# Patient Record
Sex: Male | Born: 1985 | Race: White | Hispanic: No | Marital: Single | Smoking: Current every day smoker
Health system: Southern US, Community
[De-identification: ages and names within clinical notes are randomized; demographics above are authoritative.]

## PROBLEM LIST (undated history)

## (undated) DIAGNOSIS — F419 Anxiety disorder, unspecified: Secondary | ICD-10-CM

## (undated) DIAGNOSIS — F209 Schizophrenia, unspecified: Secondary | ICD-10-CM

## (undated) DIAGNOSIS — K469 Unspecified abdominal hernia without obstruction or gangrene: Secondary | ICD-10-CM

## (undated) DIAGNOSIS — R591 Generalized enlarged lymph nodes: Secondary | ICD-10-CM

## (undated) DIAGNOSIS — N489 Disorder of penis, unspecified: Secondary | ICD-10-CM

## (undated) DIAGNOSIS — K4090 Unilateral inguinal hernia, without obstruction or gangrene, not specified as recurrent: Secondary | ICD-10-CM

## (undated) DIAGNOSIS — R5383 Other fatigue: Secondary | ICD-10-CM

## (undated) DIAGNOSIS — E785 Hyperlipidemia, unspecified: Secondary | ICD-10-CM

## (undated) DIAGNOSIS — F1991 Other psychoactive substance use, unspecified, in remission: Secondary | ICD-10-CM

## (undated) DIAGNOSIS — F32A Depression, unspecified: Secondary | ICD-10-CM

## (undated) DIAGNOSIS — F1290 Cannabis use, unspecified, uncomplicated: Secondary | ICD-10-CM

## (undated) DIAGNOSIS — F329 Major depressive disorder, single episode, unspecified: Secondary | ICD-10-CM

## (undated) DIAGNOSIS — R2231 Localized swelling, mass and lump, right upper limb: Secondary | ICD-10-CM

## (undated) DIAGNOSIS — K439 Ventral hernia without obstruction or gangrene: Secondary | ICD-10-CM

## (undated) DIAGNOSIS — F17200 Nicotine dependence, unspecified, uncomplicated: Secondary | ICD-10-CM

## (undated) HISTORY — DX: Depression, unspecified: F32.A

## (undated) HISTORY — DX: Hyperlipidemia, unspecified: E78.5

## (undated) HISTORY — DX: Anxiety disorder, unspecified: F41.9

---
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
## 2005-03-26 ENCOUNTER — Emergency Department (HOSPITAL_COMMUNITY): Admission: EM | Admit: 2005-03-26 | Discharge: 2005-03-27 | Payer: Self-pay | Admitting: Emergency Medicine

## 2005-03-27 ENCOUNTER — Ambulatory Visit: Payer: Self-pay | Admitting: Psychiatry

## 2005-03-27 ENCOUNTER — Inpatient Hospital Stay (HOSPITAL_COMMUNITY): Admission: RE | Admit: 2005-03-27 | Discharge: 2005-03-31 | Payer: Self-pay | Admitting: Psychiatry

## 2005-04-16 ENCOUNTER — Ambulatory Visit (HOSPITAL_COMMUNITY): Payer: Self-pay | Admitting: Psychiatry

## 2005-04-19 ENCOUNTER — Ambulatory Visit (HOSPITAL_COMMUNITY): Payer: Self-pay | Admitting: Psychiatry

## 2005-04-29 ENCOUNTER — Ambulatory Visit (HOSPITAL_COMMUNITY): Payer: Self-pay | Admitting: Psychiatry

## 2005-05-23 ENCOUNTER — Ambulatory Visit (HOSPITAL_COMMUNITY): Payer: Self-pay | Admitting: Psychiatry

## 2005-06-03 ENCOUNTER — Ambulatory Visit (HOSPITAL_COMMUNITY): Payer: Self-pay | Admitting: Psychiatry

## 2005-06-04 ENCOUNTER — Ambulatory Visit (HOSPITAL_COMMUNITY): Payer: Self-pay | Admitting: Psychiatry

## 2005-07-03 ENCOUNTER — Ambulatory Visit (HOSPITAL_COMMUNITY): Payer: Self-pay | Admitting: Psychiatry

## 2005-08-07 ENCOUNTER — Ambulatory Visit (HOSPITAL_COMMUNITY): Payer: Self-pay | Admitting: Psychiatry

## 2005-08-23 ENCOUNTER — Ambulatory Visit (HOSPITAL_COMMUNITY): Payer: Self-pay | Admitting: Psychiatry

## 2005-09-06 ENCOUNTER — Ambulatory Visit (HOSPITAL_COMMUNITY): Payer: Self-pay | Admitting: Psychiatry

## 2005-10-11 ENCOUNTER — Ambulatory Visit (HOSPITAL_COMMUNITY): Payer: Self-pay | Admitting: Psychiatry

## 2005-11-15 ENCOUNTER — Ambulatory Visit (HOSPITAL_COMMUNITY): Admission: RE | Admit: 2005-11-15 | Discharge: 2005-11-15 | Payer: Self-pay | Admitting: Psychiatry

## 2006-01-03 ENCOUNTER — Ambulatory Visit (HOSPITAL_COMMUNITY): Payer: Self-pay | Admitting: Psychiatry

## 2006-01-28 ENCOUNTER — Ambulatory Visit (HOSPITAL_COMMUNITY): Payer: Self-pay | Admitting: Psychiatry

## 2006-03-31 ENCOUNTER — Ambulatory Visit (HOSPITAL_COMMUNITY): Payer: Self-pay | Admitting: Psychiatry

## 2006-05-26 ENCOUNTER — Ambulatory Visit (HOSPITAL_COMMUNITY): Payer: Self-pay | Admitting: Psychiatry

## 2006-08-18 ENCOUNTER — Ambulatory Visit (HOSPITAL_COMMUNITY): Payer: Self-pay | Admitting: Psychiatry

## 2006-11-20 ENCOUNTER — Ambulatory Visit (HOSPITAL_COMMUNITY): Payer: Self-pay | Admitting: Psychiatry

## 2009-08-24 ENCOUNTER — Encounter: Payer: Self-pay | Admitting: Family Medicine

## 2009-09-21 ENCOUNTER — Encounter: Payer: Self-pay | Admitting: Family Medicine

## 2009-10-05 ENCOUNTER — Encounter: Payer: Self-pay | Admitting: Family Medicine

## 2009-11-02 ENCOUNTER — Encounter: Payer: Self-pay | Admitting: Family Medicine

## 2009-12-07 ENCOUNTER — Encounter: Payer: Self-pay | Admitting: Family Medicine

## 2010-03-07 ENCOUNTER — Encounter: Payer: Self-pay | Admitting: Family Medicine

## 2010-04-05 ENCOUNTER — Encounter: Payer: Self-pay | Admitting: Family Medicine

## 2010-07-05 ENCOUNTER — Encounter: Payer: Self-pay | Admitting: Family Medicine

## 2010-07-24 ENCOUNTER — Ambulatory Visit: Payer: Self-pay | Admitting: Family Medicine

## 2010-07-24 ENCOUNTER — Encounter: Payer: Self-pay | Admitting: Family Medicine

## 2010-07-24 DIAGNOSIS — F209 Schizophrenia, unspecified: Secondary | ICD-10-CM

## 2010-07-26 ENCOUNTER — Ambulatory Visit: Payer: Self-pay

## 2010-08-14 ENCOUNTER — Encounter: Payer: Self-pay | Admitting: Family Medicine

## 2010-09-06 NOTE — Consult Note (Signed)
[facility]   Imported By: De Nurse 07/26/2010 16:27:53  _____________________________________________________________________  External Attachment:    Type:   Image     Comment:   External Document

## 2010-09-06 NOTE — Consult Note (Signed)
Summary[facility]   Imported By: De Nurse 07/26/2010 16:27:13  _____________________________________________________________________  External Attachment:    Type:   Image     Comment:   External Document

## 2010-09-06 NOTE — Consult Note (Signed)
Summary: [facility]   Imported By: De Nurse 07/26/2010 16:26:38  _____________________________________________________________________  External Attachment:    Type:   Image     Comment:   External Document

## 2010-09-06 NOTE — Miscellaneous (Signed)
Summary: [facility]  [facility]   Imported By: De Nurse 07/26/2010 16:28:13  _____________________________________________________________________  External Attachment:    Type:   Image     Comment:   External Document

## 2010-09-06 NOTE — Consult Note (Signed)
[facility]   Imported By: De Nurse 07/26/2010 16:26:09  _____________________________________________________________________  External Attachment:    Type:   Image     Comment:   External Document

## 2010-09-06 NOTE — Consult Note (Signed)
Summary: [facility]   Imported By: De Nurse 07/26/2010 16:27:33  _____________________________________________________________________  External Attachment:    Type:   Image     Comment:   External Document

## 2010-09-06 NOTE — Assessment & Plan Note (Signed)
Summary: np,df   Vital Signs:  Patient profile:   25 year old male Height:      69.75 inches Weight:      159 pounds BMI:     23.06 Temp:     97.7 degrees F oral Pulse rate:   63 / minute Pulse rhythm:   regular BP sitting:   117 / 75  (left arm) Cuff size:   regular  Vitals Entered By: Loralee Pacas CMA (July 24, 2010 1:32 PM)  Referring Provider:  Dr. Yetta Barre Primary Provider:  Majel Homer, MD  CC:  Lab tests.  History of Present Illness: Pt is a 25 year old male diagnosed with schizophrenia 5 years ago who presents today asking for labs to monitor his medications.  He is seen by a psychiatrist, Dr. Yetta Barre, who is managing his medications.  He has no current complaints or concerns.  He says his disease is well controlled on his current medications.  Habits & Providers  Alcohol-Tobacco-Diet     Alcohol drinks/day: <1     Tobacco Status: current     Tobacco Counseling: to quit use of tobacco products     Cigarette Packs/Day: 0.5     Year Started: age [age]  Exercise-Depression-Behavior     Does Patient Exercise: yes     Type of exercise: Bike  Past History:  Past Medical History: Schizophrenia - dx at age 37, hospitalized at the time, stable for past 3 years  Past Surgical History: Wisdom teeth removeed  Family History: Father smokes Grandfather smoked and died of lung ca  Social History: Disability from schizophremia Smokes 1/2 ppd for several years 3 drinks 1-2 times per month Does Patient Exercise:  yes Smoking Status:  current Packs/Day:  0.5  Physical Exam  General:  Well-developed,well-nourished,in no acute distress; alert,appropriate and cooperative throughout examination Ears:  External ear exam shows no significant lesions or deformities.  Otoscopic examination reveals clear canals, tympanic membranes are intact bilaterally without bulging, retraction, inflammation or discharge. Hearing is grossly normal bilaterally. Mouth:  Oral mucosa and  oropharynx without lesions or exudates.  Teeth in good repair. Neck:  No deformities, masses, or tenderness noted. Lungs:  Normal respiratory effort, chest expands symmetrically. Lungs are clear to auscultation, no crackles or wheezes. Heart:  Normal rate and regular rhythm. S1 and S2 normal without gallop, murmur, click, rub or other extra sounds. Abdomen:  Bowel sounds positive,abdomen soft and non-tender without masses, organomegaly or hernias noted. Msk:  No deformity or scoliosis noted of thoracic or lumbar spine.   Extremities:  No clubbing, cyanosis, edema, or deformity noted with normal full range of motion of all joints.   Psych:  Cognition and judgment appear intact. Alert and cooperative with normal attention span and concentration. No apparent delusions, illusions, hallucinations   Impression & Recommendations:  Problem # 1:  UNSPECIFIED SCHIZOPHRENIA CHRONIC CONDITION (ICD-295.92) Will obtain labs to monitor for any side effects of abilify.  Will plan on fasting glucose and FLP every six months for further monitoring.  Have had patient sign a release of information form so that we can fax results of the tests to his psychiatrist.  Future Orders: Comp Met[facility] [id number]) ... 07/17/2011 Lipid[facility] ([id number]) ... 07/17/2011 CBC[facility] ([id number]) ... 07/17/2011 TSH[facility] [id number]) ... 07/17/2011  Complete Medication List: 1)  Abilify 30 Mg Tabs (Aripiprazole) .... Take one tablet daily 2)  Hydroxyzine Hcl 50 Mg Tabs (Hydroxyzine hcl) .Marland Kitchen.. 1-2 capsules as needed for anxiety  Patient Instructions: 1)  It was great  to see you today! 2)  Make an appointment for your labs to be drawn sometime in the morning when you are fasting in the next week or two.  We will let you know the results and get them to Dr. Yetta Barre. 3)  Make an appointment to see me again in about 6 months.   Orders Added: 1)  Comp Met[facility] [[id number]] 2)  Lipid[facility] [[id number]] 3)  CBC[facility] [[id number]] 4)  TSH[facility]  [[id number]] 5)  [facility] [99203]

## 2010-09-06 NOTE — Consult Note (Signed)
[facility]   Imported By: De Nurse 07/26/2010 16:26:24  _____________________________________________________________________  External Attachment:    Type:   Image     Comment:   External Document

## 2010-09-06 NOTE — Consult Note (Signed)
Summary[facility]   Imported By: De Nurse 07/26/2010 16:25:29  _____________________________________________________________________  External Attachment:    Type:   Image     Comment:   External Document

## 2010-09-06 NOTE — Consult Note (Signed)
Summary: [facility]   Imported By: De Nurse 07/26/2010 16:25:02  _____________________________________________________________________  External Attachment:    Type:   Image     Comment:   External Document

## 2010-09-06 NOTE — Letter (Signed)
Summary: Generic Letter  [facility]  [address]   [place], [place] [address]   Phone: [phone]  Fax: [phone]    08/14/2010  Otto Resh [address] [place], [place]  [address]  Dear Mr. STAN,  It has come to my attention that you have missed your appointment for your lab tests.  Please call our office at [phone] and set up a time for your lab tests.  Remember, you will have to be fasting for these so early morning is probably best.  It is important for your health that we obtain these tests.   Sincerely,   Majel Homer MD  Appended Document: Generic Letter sent

## 2010-09-17 ENCOUNTER — Encounter: Payer: Self-pay | Admitting: *Deleted

## 2010-12-10 ENCOUNTER — Inpatient Hospital Stay (INDEPENDENT_AMBULATORY_CARE_PROVIDER_SITE_OTHER)
Admission: RE | Admit: 2010-12-10 | Discharge: 2010-12-10 | Disposition: A | Discharge status: Home / Self Care | Payer: Medicaid Other | Source: Ambulatory Visit | Attending: Family Medicine | Admitting: Family Medicine

## 2010-12-10 ENCOUNTER — Ambulatory Visit (INDEPENDENT_AMBULATORY_CARE_PROVIDER_SITE_OTHER): Payer: Medicaid Other

## 2010-12-10 DIAGNOSIS — IMO0002 Reserved for concepts with insufficient information to code with codable children: Secondary | ICD-10-CM

## 2010-12-10 DIAGNOSIS — X58XXXA Exposure to other specified factors, initial encounter: Secondary | ICD-10-CM

## 2010-12-17 ENCOUNTER — Encounter: Payer: Self-pay | Admitting: Sports Medicine

## 2010-12-17 ENCOUNTER — Ambulatory Visit (INDEPENDENT_AMBULATORY_CARE_PROVIDER_SITE_OTHER): Payer: Medicaid Other | Admitting: Sports Medicine

## 2010-12-17 VITALS — BP 103/74 | HR 65 | Temp 98.2°F | Ht 71.0 in | Wt 153.6 lb

## 2010-12-17 DIAGNOSIS — S62629A Displaced fracture of medial phalanx of unspecified finger, initial encounter for closed fracture: Secondary | ICD-10-CM | POA: Insufficient documentation

## 2010-12-17 DIAGNOSIS — IMO0002 Reserved for concepts with insufficient information to code with codable children: Secondary | ICD-10-CM

## 2010-12-17 MED ORDER — HYDROCODONE-ACETAMINOPHEN 5-500 MG PO TABS: 1.0000 | ORAL_TABLET | ORAL | Status: DC | PRN

## 2010-12-17 NOTE — Progress Notes (Signed)
Addended by: Rodney Langton on: 12/17/2010 12:43 PM   Modules accepted: Orders

## 2010-12-17 NOTE — Progress Notes (Signed)
  Subjective:    Patient ID: Dylan Thompson, male    DOB: 03-06-1986, 25 y.o.   MRN: [id number]  HPI Punched tree 7d ago, fx R 4th middle phalanx with dorsal angulation.  XR at [facility].  Here for f/u.  Did not f/u w ortho hand as recommended by [facility].  Painful, needs medication.   Review of Systems    See HPI Objective:   Physical Exam  Constitutional: He appears well-developed and well-nourished. No distress.  Musculoskeletal:       NVI distally. Mild swelling and pain over Rt 4th middle phalanx. No visible deformity. Able to bend at PIP and DIP joints, but limited by pain.   Ulnar gutter splint placed.    XR w/ ~30 deg dorsal angulation.  Assessment & Plan:

## 2010-12-17 NOTE — Assessment & Plan Note (Signed)
Ulnar gutter splint placed. Vicodin for pain. To Dr. Luiz Blare with [facility] tomorrow at 11am. RTC prn.

## 2010-12-19 ENCOUNTER — Ambulatory Visit (HOSPITAL_BASED_OUTPATIENT_CLINIC_OR_DEPARTMENT_OTHER)
Admission: RE | Admit: 2010-12-19 | Discharge: 2010-12-19 | Disposition: A | Discharge status: Home / Self Care | Payer: Medicaid Other | Source: Ambulatory Visit | Attending: Orthopedic Surgery | Admitting: Orthopedic Surgery

## 2010-12-19 DIAGNOSIS — Z01812 Encounter for preprocedural laboratory examination: Secondary | ICD-10-CM | POA: Insufficient documentation

## 2010-12-19 DIAGNOSIS — X838XXA Intentional self-harm by other specified means, initial encounter: Secondary | ICD-10-CM | POA: Insufficient documentation

## 2010-12-19 DIAGNOSIS — IMO0002 Reserved for concepts with insufficient information to code with codable children: Secondary | ICD-10-CM | POA: Insufficient documentation

## 2010-12-19 LAB — POCT HEMOGLOBIN-HEMACUE: Hemoglobin: 17.3 g/dL — ABNORMAL HIGH (ref 13.0–17.0)

## 2010-12-21 NOTE — Discharge Summary (Signed)
NAME:  Dylan Thompson, Dylan Thompson NO.:  [id number]   MEDICAL RECORD NO.:  [id number]          PATIENT TYPE:  IPS   LOCATION:  0407                          FACILITY:  [facility]   PHYSICIAN:  Jeanice Lim, M.D. DATE OF BIRTH:  November 15, 1985   DATE OF ADMISSION:  03/27/2005  DATE OF DISCHARGE:  03/31/2005                                 DISCHARGE SUMMARY   IDENTIFYING DATA:  This is a 25 year old single Caucasian male voluntarily  admitted.  Presenting with a history of psychosis and flashbacks.  Jittery.  Reported voices telling her to kill herself.   MEDICATIONS:  Adderall in the past.  Not sure who prescribed it.   ALLERGIES:  No known drug allergies.   PHYSICAL EXAMINATION:  Physical and neurologic exam within normal limits.   LABORATORY DATA:  Routine admission labs within normal limits.   PAST PSYCHIATRIC HISTORY:  First psychiatric hospitalization.  Has a history  of cutting self at age 40.   SOCIAL HISTORY:  25 year old single white male with no children, in college.   MENTAL STATUS EXAM:  Otherwise cooperative male.  Poor eye contact.  Casually dressed.  Speech clear.  Mood bored and somewhat depressed.  Affect  flat.  Some irritability at times.  Thought processes positive for suicidal  ideation and intrusive thoughts and reporting visual hallucinations at  times.  Cognitively intact.  Judgment and insight were impaired.   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  Possible schizoaffective disorder.  Cannabis abuse.  AXIS II:  No diagnosis.  AXIS III:  None.  AXIS IV:  Moderate (problems with multiple psychosocial issues).  AXIS V:  35/65.   HOSPITAL COURSE:  The patient was admitted to be stabilized on medications  and monitored for safety.  Placed on safety checks.  Initiated on Abilify.  Adderall was held.  Family session was requested and importance of  compliance and insight regarding  medications and condition and impact of  hallucinogen usage were reviewed with patient.  The patient was given  substance abuse treatment.  Initially guarded, suspicious, improved with  stabilization and was reporting at least motivation to at least stay away  from marijuana, LSD, mushrooms and alcohol.  The patient was discharged in  improved condition, feeling much better.  Mood was euthymic.  No overt  psychotic symptoms.  Risk/benefit ratio and alternative treatments regarding  medications were discussed.  Since patient had cleared and appeared to be  substance-induced psychosis, the patient was discharged with no  psychotropics at this time, although the patient did appear to have an  underlying thought disorder and would likely need psychotropics in the  future after being fully evaluated.  The patient wanted to get a therapist  evaluation and see a psychiatrist on the outside and then would reconsider  medications.  Differential diagnosis includes a psychotic disorder not  otherwise specified, schizophreniform, bipolar disorder with psychotic  features, schizotypal personality traits versus substance-induced mood  disorder and substance-induced psychotic disorder and adjustment disorder  with mixed emotions.  Recommended for patient to  receive therapy.  Follow-up  with diagnostic and psychological testing and monitoring medication  evaluation and to reconsider taking medications if psychotic symptoms  persist and absence from substances is critical to clarify diagnosis.  The  patient was agreeable.  There were no safety issues.  No command  hallucinations reported or overt psychotic symptoms at the time of  discharge.  No suicidal or homicidal ideation.   CONDITION ON DISCHARGE:  The patient was discharged in partially improved  condition.   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  Possible schizoaffective  disorder.  Cannabis abuse.  AXIS II:  No diagnosis.  AXIS III:  None.  AXIS IV:  Moderate (problems with multiple psychosocial issues).  AXIS V:  GAF on discharge 55.      Jeanice Lim, M.D.  Electronically Signed     JEM/MEDQ  D:  05/12/2005  T:  05/13/2005  Job:  [id number]

## 2011-01-08 NOTE — Op Note (Addendum)
  NAME:  Dylan Thompson, Dylan Thompson NO.:  [id number]  MEDICAL RECORD NO.:  [id number]  LOCATION:                                 FACILITY:  PHYSICIAN:  Harvie Junior, M.D.   DATE OF BIRTH:  [birth year]  DATE OF PROCEDURE:  12/19/2010 DATE OF DISCHARGE:12/19/2010                              OPERATIVE REPORT   He is a 25 year old gentleman who suffered an unfortunate injury to his right ring finger.  I ultimately had a long discussion with him about treatments because of the nature of the displaced injury.  We felt that the most appropriate course of action for him was going to be open reduction and internal fixation.  He was ultimately taken to the operating room for this procedure.  We did discuss close treatment with him, but felt that it would not hold given that the nature of the bicondylar fracture.  He did have some rotational malalignment preoperatively which we thought was problematic.  He is brought to the operating room for this procedure.  PROCEDURE:  The patient was brought to the operating room.  After adequate level of anesthesia was obtained with general anesthetic, the patient was placed supine on the operating table.  The right hand was then prepped and draped in the usual sterile fashion.  Following this, a manipulative closed reduction was undertaken of the right index and right ring finger.  Fluoroscopic images were used to make sure this was an anatomic reduction and crossed K-wires were used to obtain and hold anatomic reduction.  Once this was achieved, the wires were cut and bent.  Final imaging showed anatomic reduction.  At this point, the patient was placed into a finger splint and a ulnar gutter splint and taken to recovery room where he was noted to be in satisfactory condition.  Estimated blood loss procedure was none.  Multiple fluoroscopic images were used during the case and Marshia Ly assisted in the case and was critical in the  performance of the case with holding fractures.     Harvie Junior, M.D.     Ranae Plumber  D:  01/03/2011  T:  01/04/2011  Job:  [id number]  Electronically Signed by Jodi Geralds M.D. on 01/11/2011 01:39:37 PM

## 2011-12-09 ENCOUNTER — Other Ambulatory Visit: Payer: Self-pay | Admitting: Family Medicine

## 2012-08-05 HISTORY — PX: FINGER FRACTURE SURGERY: SHX638

## 2013-05-05 ENCOUNTER — Encounter: Payer: Self-pay | Admitting: Family Medicine

## 2013-05-05 ENCOUNTER — Ambulatory Visit (INDEPENDENT_AMBULATORY_CARE_PROVIDER_SITE_OTHER): Payer: Medicaid Other | Admitting: Family Medicine

## 2013-05-05 VITALS — BP 101/69 | HR 79 | Temp 97.4°F | Wt 144.0 lb

## 2013-05-05 DIAGNOSIS — J029 Acute pharyngitis, unspecified: Secondary | ICD-10-CM

## 2013-05-05 LAB — POCT RAPID STREP A (OFFICE): Rapid Strep A Screen: NEGATIVE

## 2013-05-05 MED ORDER — HYDROCODONE-ACETAMINOPHEN 10-325 MG PO TABS: 1.0000 | ORAL_TABLET | Freq: Three times a day (TID) | ORAL | Status: DC | PRN

## 2013-05-05 MED ORDER — AMOXICILLIN 500 MG PO CAPS: 500.0000 mg | ORAL_CAPSULE | Freq: Three times a day (TID) | ORAL | Status: DC

## 2013-05-05 NOTE — Patient Instructions (Addendum)

## 2013-05-05 NOTE — Progress Notes (Signed)
Patient ID: Dylan Thompson, male   DOB: 26-Apr-1986, 27 y.o.   MRN: [id number]  [facility] Racquel Arkin M. Miche Loughridge, MD Phone: [phone]   Subjective: HPI: Patient is a 27 y.o. male presenting to clinic today for sore throat and lymphadenopathy.  Sore Throat Patient complains of sore throat for 3 days. Associated symptoms include headache, sinus and nasal congestion, sore throat and swollen glands. Denies fevers or chills. Onset of symptoms was 3 days ago, and have been gradually worsening since that time. He is drinking moderate amounts of fluids. He has not had recent close exposure to someone with proven streptococcal pharyngitis. At home, he has tried throat spray and Nyquil which did not help much. No recent episodes of strep throat. Able to swallow ok, no drooling, no difficulty breathing.  History Reviewed: Daily smoker 1/2 ppd. Health Maintenance: No flu shot this year  ROS: Please see HPI above.  Objective: Office vital signs reviewed. BP 101/69  Pulse 79  Temp(Src) 97.4 F (36.3 C) (Oral)  Wt 144 lb (65.318 kg)  BMI 20.09 kg/m2  Physical Examination:  General: Awake, alert. NAD, but appears ill. HEENT: Atraumatic, normocephalic. MMM. Posterior pharynx erythematous, edematous with 2+ tonsils and moderate amount of exudate. TM wnl Neck: No masses palpated. +LAD Pulm: CTAB, no wheezes Cardio: RRR, no murmurs appreciated Abdomen:+BS, soft, nontender, nondistended Extremities: No edema Neuro: Grossly intact  Assessment: 27 y.o. male with pharyngitis  Plan: See Problem List and After Visit Summary

## 2013-05-06 DIAGNOSIS — J029 Acute pharyngitis, unspecified: Secondary | ICD-10-CM | POA: Insufficient documentation

## 2013-05-06 NOTE — Assessment & Plan Note (Signed)
Rapid strep negative, but clinically appears to be bacterial pharyngitis. Will treat with Amoxicillin. Also given Vicodin #10 for discomfort. Patient advised to return if he worsens or develops a rash (will need mono spot at that time.)

## 2013-05-12 ENCOUNTER — Telehealth: Payer: Self-pay | Admitting: Family Medicine

## 2013-05-12 NOTE — Telephone Encounter (Signed)
Patient still having the same symptoms as he did when he was seen by [facility] on 10/1. Mother is wanting to know what to do?

## 2013-05-12 NOTE — Telephone Encounter (Signed)
Appt made for Friday on crosscover, mom just states that pt is not any better and cough is still bad.  Laurajean Hosek,CMA

## 2013-05-14 ENCOUNTER — Ambulatory Visit (INDEPENDENT_AMBULATORY_CARE_PROVIDER_SITE_OTHER): Payer: Medicaid Other | Admitting: Family Medicine

## 2013-05-14 VITALS — BP 102/72 | HR 76 | Temp 98.2°F | Ht 71.75 in | Wt 145.7 lb

## 2013-05-14 DIAGNOSIS — Z7251 High risk heterosexual behavior: Secondary | ICD-10-CM

## 2013-05-14 DIAGNOSIS — J029 Acute pharyngitis, unspecified: Secondary | ICD-10-CM

## 2013-05-14 LAB — RPR

## 2013-05-14 MED ORDER — CEFTRIAXONE SODIUM 1 G IJ SOLR
1.0000 g | Freq: Once | INTRAMUSCULAR | Status: AC
  Administered 2013-05-14: 1 g via INTRAMUSCULAR

## 2013-05-14 NOTE — Progress Notes (Signed)
Family Medicine Office Visit Note   Subjective:   Patient ID: Dylan Thompson, male  DOB: 1986/03/17, 27 y.o.. MRN: [id number]   Pt that comes today for same day appointment complaining of sore throat. He was seen previously here on October 1 for same condition strep was negative and Centor criteria guided treatment with Amoxicillin, that patient completed 3 days ago. Patient reports compliance with medication and denied side effects. He reports pain continues worse with swallowing, but is able to drink fluids. He denies fevers, chills, joint involvement or rashes. No nausea or vomiting. He also reports recent unprotected oral sexual encounters as part of the concern for his present condition  Review of Systems:  Per HPI  Objective:   Physical Exam: Gen:  NAD HEENT: Moist mucous membranes. Very enlarged tonsils bilaterally, erythema and only one visible exudate present on his right tonsil. Marked tender maxillary adenopathies bilaterally.   Neck supple. CV: Regular rate and rhythm, no murmurs. PULM: Clear to auscultation bilaterally. EXT: No edema Neuro: Alert and oriented x3. No focalization  Assessment & Plan:

## 2013-05-14 NOTE — Addendum Note (Signed)
Addended by: Jimmy Footman K on: 05/14/2013 04:10 PM   Modules accepted: Orders

## 2013-05-14 NOTE — Assessment & Plan Note (Signed)
Pharyngitis that did not respond to with amoxicillin treatment. Visible lesion seen on right tonsil. Plan/ Discussed with attending Dr. Lum Babe. Concerns for other bacterial causes of pharyngitis. Cultures for GC and chlamydia has been sent. RPR and HIV  Ceftriaxone times one given in the office. We will await results of cultures in order to target other organisms. X-ray of the neck ordered in case the patient's condition further deteriorates.  Discussed in depth with patient signs symptoms that should prompt evaluation.

## 2013-05-14 NOTE — Patient Instructions (Signed)
Will call you if any of the lab results come back abnormal. If you continue to worsen there is an x-ray has been ordered please get that test done and make a followup appointment with [place]. You develop any kind of difficulty swallowing, drooling, or difficulty breathing please go to emergency department as soon as possible.

## 2013-05-17 LAB — GONOCOCCUS CULTURE

## 2013-05-18 ENCOUNTER — Telehealth: Payer: Self-pay | Admitting: Family Medicine

## 2013-05-18 NOTE — Telephone Encounter (Signed)
Please inform patient of his lab results from [facility] 05/14/13. [phone]

## 2013-05-18 NOTE — Telephone Encounter (Signed)
Please advise 

## 2013-05-21 NOTE — Telephone Encounter (Signed)
Tried to call patient back but kept receiving message that number was dialed incorrectly.

## 2013-05-24 NOTE — Telephone Encounter (Signed)
Spoke with patient and gave lab results 

## 2013-05-24 NOTE — Telephone Encounter (Signed)
Patient's mother calls today, unable to give results to her because patient of legal age. Please call patient back to give results. Either [phone] or [phone].

## 2013-11-11 ENCOUNTER — Emergency Department (HOSPITAL_COMMUNITY)
Admission: EM | Admit: 2013-11-11 | Discharge: 2013-11-11 | Discharge status: Against Medical Advice | Payer: Medicaid Other | Attending: Emergency Medicine | Admitting: Emergency Medicine

## 2013-11-11 ENCOUNTER — Encounter (HOSPITAL_COMMUNITY): Payer: Self-pay | Admitting: Emergency Medicine

## 2013-11-11 DIAGNOSIS — R209 Unspecified disturbances of skin sensation: Secondary | ICD-10-CM | POA: Diagnosis not present

## 2013-11-11 DIAGNOSIS — F17200 Nicotine dependence, unspecified, uncomplicated: Secondary | ICD-10-CM | POA: Insufficient documentation

## 2013-11-11 DIAGNOSIS — F411 Generalized anxiety disorder: Secondary | ICD-10-CM | POA: Diagnosis not present

## 2013-11-11 DIAGNOSIS — R05 Cough: Secondary | ICD-10-CM | POA: Insufficient documentation

## 2013-11-11 DIAGNOSIS — R0602 Shortness of breath: Secondary | ICD-10-CM | POA: Diagnosis not present

## 2013-11-11 DIAGNOSIS — R059 Cough, unspecified: Secondary | ICD-10-CM | POA: Diagnosis not present

## 2013-11-11 HISTORY — DX: Schizophrenia, unspecified: F20.9

## 2013-11-11 LAB — I-STAT CHEM 8, ED
BUN: 8 mg/dL (ref 6–23)
CHLORIDE: 100 meq/L (ref 96–112)
CREATININE: 1.2 mg/dL (ref 0.50–1.35)
Calcium, Ion: 1.21 mmol/L (ref 1.12–1.23)
Glucose, Bld: 100 mg/dL — ABNORMAL HIGH (ref 70–99)
HCT: 50 % (ref 39.0–52.0)
Hemoglobin: 17 g/dL (ref 13.0–17.0)
Potassium: 3.2 mEq/L — ABNORMAL LOW (ref 3.7–5.3)
SODIUM: 141 meq/L (ref 137–147)
TCO2: 28 mmol/L (ref 0–100)

## 2013-11-11 LAB — CBC
HCT: 46.9 % (ref 39.0–52.0)
HEMOGLOBIN: 17.3 g/dL — AB (ref 13.0–17.0)
MCH: 30.9 pg (ref 26.0–34.0)
MCHC: 36.9 g/dL — ABNORMAL HIGH (ref 30.0–36.0)
MCV: 83.8 fL (ref 78.0–100.0)
Platelets: 323 10*3/uL (ref 150–400)
RBC: 5.6 MIL/uL (ref 4.22–5.81)
RDW: 12.6 % (ref 11.5–15.5)
WBC: 12.9 10*3/uL — AB (ref 4.0–10.5)

## 2013-11-11 LAB — TROPONIN I

## 2013-11-11 NOTE — ED Notes (Signed)
Pt with hx of schizophrenia to ED c/o cough, sob, feeling like he's "going under" and tingling forehead.  Mother states he has a court date today.  Pt denies drug use, but states he has not taken meds x 1 week.  VS stable.

## 2013-11-11 NOTE — ED Notes (Addendum)
Pt at desk requesting note and stating he is leaving, explained that he has not been seen yet, pt being verbally abusive with registration, asked to leave desk by ED Director Misty, pt was noted to leave buidling

## 2015-10-09 ENCOUNTER — Encounter: Payer: Medicaid Other | Admitting: Family Medicine

## 2016-03-20 ENCOUNTER — Encounter: Payer: Self-pay | Admitting: Family Medicine

## 2016-03-20 ENCOUNTER — Ambulatory Visit (INDEPENDENT_AMBULATORY_CARE_PROVIDER_SITE_OTHER): Payer: Medicaid Other | Admitting: Family Medicine

## 2016-03-20 ENCOUNTER — Ambulatory Visit: Payer: Medicaid Other | Admitting: Family Medicine

## 2016-03-20 VITALS — BP 95/66 | HR 74 | Temp 97.6°F | Wt 151.6 lb

## 2016-03-20 DIAGNOSIS — K429 Umbilical hernia without obstruction or gangrene: Secondary | ICD-10-CM | POA: Diagnosis present

## 2016-03-20 NOTE — Patient Instructions (Signed)
It was a pleasure seeing you today in our clinic. Today we discussed your abdominal hernia. Here is the treatment plan we have discussed and agreed upon together:   - I have sent in a referral to general surgery. You'll be contacted later this week or early next week to set up an appointment at their office. At that time they will discuss with you their recommendations for treatment of this hernia. - Follow-up with us as needed have any additional concerns.

## 2016-03-20 NOTE — Assessment & Plan Note (Signed)
Patient is here for complaints consistent with an abdominal/umbilical hernia. Wall defect appears to have been present over the past decade. No significant complications to date but symptoms appear to be related to physical exercise/strenuous activity. Patient states he would like something done if there is a risk for complications down the road. From my examination today there is not seem to be any acute/urgent matter needing addressed at this time. Patient appears fairly fit and has good muscle tone throughout. Because of this I do not believe this is a issue acquired from chronic abdominal muscle weakness.  - General surgery referral; discussed about the possibility of surgical repair. Patient stated his understanding and willingness to move forward if this was recommended by general surgery. Patient also stated his understanding that Gen. surgery may recommend conservative treatment at this time.

## 2016-03-20 NOTE — Progress Notes (Signed)
   HPI  CC: Abdominal mass Patient is here with complaints of abdominal mass. He states that the mass is not always present but typically presents itself/swells after he performs strenuous physical activity or after a abnormally large meal. He first noticed this mass about 7 years ago and never thought it was an issue. He is here today because his mother insisted he get it checked out. Mass is located just superior to the umbilicus along the midline. Masses typically nonpainful but occasionally will have some discomfort. Patient states that this mass is always reducible but has before he can cause some discomfort when doing so. He denies any episode in which this mass seemed to get "stuck" in a protruding manner.  ROS: He denies any fever, chills, acute abdominal pain, hematochezia, melena, constipation, diarrhea, nausea, or vomiting.  CC, SH/smoking status, and VS noted  Objective: BP 95/66 (BP Location: Left Arm, Patient Position: Sitting, Cuff Size: Normal)   Pulse 74   Temp 97.6 F (36.4 C) (Oral)   Wt 151 lb 9.6 oz (68.8 kg)   SpO2 97%   BMI 20.70 kg/m  Gen: NAD, alert, cooperative. CV: Well-perfused. Resp: Non-labored. Neuro: Sensation intact throughout. Abd: SNTND, no masses present but a ~3cm abdominal wall defect present immediately superior to the umbilicus. No surrounding erythema. BS present, no guarding or organomegaly  Assessment and plan:  Umbilical hernia without obstruction and without gangrene Patient is here for complaints consistent with an abdominal/umbilical hernia. Wall defect appears to have been present over the past decade. No significant complications to date but symptoms appear to be related to physical exercise/strenuous activity. Patient states he would like something done if there is a risk for complications down the road. From my examination today there is not seem to be any acute/urgent matter needing addressed at this time. Patient appears fairly fit and  has good muscle tone throughout. Because of this I do not believe this is a issue acquired from chronic abdominal muscle weakness.  - General surgery referral; discussed about the possibility of surgical repair. Patient stated his understanding and willingness to move forward if this was recommended by general surgery. Patient also stated his understanding that Gen. surgery may recommend conservative treatment at this time.   Orders Placed This Encounter  Procedures  . Ambulatory referral to General Surgery    Referral Priority:   Routine    Referral Type:   Surgical    Referral Reason:   Specialty Services Required    Requested Specialty:   General Surgery    Number of Visits Requested:   1    Kathee Deltonan D McKeag, MD,MS,  PGY3 03/20/2016 5:54 PM

## 2016-04-04 ENCOUNTER — Telehealth: Payer: Self-pay | Admitting: Family Medicine

## 2016-04-04 NOTE — Telephone Encounter (Signed)
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  She wonders if he could talk to the social work about possible roadbloaks.  Please call mom Misty StanleyLisa Colledge  [phone]

## 2016-04-04 NOTE — Progress Notes (Addendum)
Social work Tax inspectorintern received consult from front office that patients mother was requesting resources. Social work Tax inspectorintern called mom Doctor, hospitalLisa Thompson. Son Dylan Thompson is now living with her. Dylan StanleyLisa said her son has been a heavy marijuana user for many years and has been using meth occasionally for 2 years. She believes he is now trying to stop his drug usage and says he is showing signs of withdraws. Dylan StanleyLisa is hoping to receive resources to assist her in helping his withdraw process as well as resources to teach him life skills so he can live on his own. The social work Tax inspectorintern gave her information on the [facility] [employer] and [employer].  Plan: Dylan StanleyLisa will discuss reaching out to these resources with her son. Dylan StanleyLisa will call social worker back if additional resources or help is needed.   Melida Quitteriara Janoah Menna BSW Social Work Intern  [phone] [facility]

## 2016-05-09 ENCOUNTER — Telehealth: Payer: Self-pay | Admitting: Family Medicine

## 2016-05-09 NOTE — Telephone Encounter (Signed)
Physical form dropped off for at front desk for completion.  Verified that patient section of form has been completed.  Last DOS  with PCP was 03-20-2016. Also, Patient asks PCP's comments about hernia.  Placed form in team folder to be completed by clinical staff.  Rosa A Antony Odeaelgado Martin

## 2016-05-14 NOTE — Telephone Encounter (Signed)
Physical form for Dylan Thompson placed in Dr. Kristopher OppenheimWarden's box for completion.  However we will need a release of information filled out before we can fax this information to the requested party.  Contacted pt and he stated that he will come by and fill out a release of information so we can fax it to the requested person once completed. Please place in white team folder and route a message once this ROI has been completed so we can fax it.  Dylan Thompson, [name], [place]

## 2016-05-16 ENCOUNTER — Telehealth: Payer: Self-pay | Admitting: Family Medicine

## 2016-05-16 NOTE — Telephone Encounter (Signed)
Dad would like to know when the form will be filled out and faxed, form was dropped off on the 5th. Dad faxed in ROI yesterday. I put the ROI in Dr. Kristopher OppenheimWarden's box and attached it to the form. Please advise. Thanks! ep

## 2016-05-16 NOTE — Telephone Encounter (Signed)
Spoke with Dylan Thompson and told him that I cannot fill out the form because he has not had a physical exam here within the last year.  He had a focused exam with Dr. Wende MottMckeag in August, but I will be unable to fill out the majority of the paper work.  I suggested he call and make an appointment for physical exam and I will gladly fill these out. He had concerns about paying because they don't have insurance.  I told him to call the office and ask to speak with financial counselor.   Thanks!

## 2016-06-05 ENCOUNTER — Encounter: Payer: Medicaid Other | Admitting: Family Medicine

## 2016-06-06 ENCOUNTER — Ambulatory Visit: Payer: Self-pay | Attending: Internal Medicine

## 2016-06-07 ENCOUNTER — Ambulatory Visit (INDEPENDENT_AMBULATORY_CARE_PROVIDER_SITE_OTHER): Payer: Self-pay | Admitting: Family Medicine

## 2016-06-07 ENCOUNTER — Encounter: Payer: Self-pay | Admitting: Family Medicine

## 2016-06-07 DIAGNOSIS — F209 Schizophrenia, unspecified: Secondary | ICD-10-CM

## 2016-06-07 DIAGNOSIS — Z0000 Encounter for general adult medical examination without abnormal findings: Secondary | ICD-10-CM

## 2016-06-07 NOTE — Progress Notes (Signed)
Subjective:  Dylan Thompson is a 30 y.o. male who presents to the [facility] today for physical exam, no chief complaint.  HPI:  Healthcare maintenance: Patient here today for physical exam.  Was contacted by the patient's father asking if I could fill out a medical form for clearance for a rehabilitation program that he plans to attend in [place]. 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  I have no labs for this gentleman.  He has no insurance at this point, and we had talked about what he is willing to pay for with regards to labs.  He has had no sexual activity in the past couple of years, and does not feel like he needs any STD screening at this point.  He feels otherwise well, no fevers, chest pain, shortness of breath, nausea vomiting or diarrhea no other pains anywhere else.  Patient smokes 1 pack per day, with a-pack-year history of 14 years.  No desire to quit.   Schizophrenia: See psychiatrist Tamela OddiJO Hughes at [facility]. Apparently saw her yesterday.  Takes Abilify 30 mg daily and states that his schizophrenia is stable.  I do not have access to these records to verify.  When asked about mood, he states that he is more stable than he has been in years and that she is planning to go to [place] for rehabilitation for schizophrenia.  She denies any thoughts of harming himself or others, and denies any auditory or visual hallucinations.  He is alert and oriented 3. Reported that his psychiatrist has not taken any blood on him.   PMH: Schizophrenia, umbilical hernia ROS: See history of present illness SH: Current smoker 1 pack per day with 14-pack-year history  Objective:  Physical Exam: BP 107/69 (BP Location: Left Arm, Patient Position: Sitting, Cuff Size: Normal)   Pulse 80   Temp 97.7 F (36.5 C) (Oral)   Wt 162 lb 9.6 oz (73.8 kg)   BMI 22.21 kg/m   Gen: 30  year-old male in NAD, resting comfortably CV: RRR with no murmurs appreciated Pulm: NWOB, CTAB with no crackles, wheezes, or rhonchi GI: Normal bowel sounds present. Soft, Nontender, Nondistended. Small, stable umbilical hernia, nontender with no signs of strangulation or necrosis.  MSK: no edema, cyanosis, or clubbing noted Skin: warm, dry Neuro: grossly normal, moves all extremities Psych: Normal affect and thought content  No results found for this or any previous visit (from the past 72 hour(s)).   Assessment/Plan:  Health care maintenance Physical exam was unremarkable. Do not have any labs on this patient. He does not have insurance, but would like to obtain lipid panel and hemoglobin A1c and CMP to assess whether there are any electrolyte abnormalities due to his long-term Abilify use.  I offered his labs this patient and he stated that he will come back to get these.  Additionally I recommended that he get a flu shot, and tetanus shot at his local pharmacy as this would be cheaper than in clinic and he can also go to the health department or [facility] to see if STD screening. - Follow-up CMP, hemoglobin A1c and lipid panel - Receive flu shot and tetanus shot at pharmacy - [facility] or health department  Schizophrenia, chronic condition (HCC) Reported history of schizophrenia, followed by Tamela OddiJO Hughes at [facility]. Currently on Abilify 30 g daily  and states that his condition is stable. Was diagnosed 7 years ago and is trying to get his life back together and plans to go to rehabilitation program in [place].  Reportedly has had no blood work done.  Offered multiple tests, however strongly recommended at least a CMP. Patient stated that he will follow up and get this done.  Denies any auditory or visual hallucinations, having thoughts of harming himself or others and is alert and oriented 3.  - Reportedly stable, do not have access to [facility]  documentation - Follow-up CMP

## 2016-06-07 NOTE — Patient Instructions (Signed)
Dylan Thompson, you were seen today for physical exam and you're looking well.  Since you don't have insurance, I have held off on ordering some labs, although I am recommending that you do receive a comprehensive metabolic panel because you are on Abilify.  Please follow-up and received this lab.  Additionally, I would recommend that you go to your local pharmacy to receive a flu shot and tetanus shot as these would be cheaper there versus in clinic.  You can also receive cheaper STD testing at [facility] or the health department.  I wrote provided you with a letter stating that you are cleared medically to attend your program in [place].  When you're able to provide me with the other paperwork I'm happy to fill that out too.   It was nice meeting you today, Dylan BruntDaniel L. Myrtie SomanWarden, MD [facility] Resident PGY-1 06/07/2016 11:48 AM

## 2016-06-07 NOTE — Assessment & Plan Note (Signed)
Reported history of schizophrenia, followed by Tamela OddiJO Hughes at [facility]. Currently on Abilify 30 g daily and states that his condition is stable. Was diagnosed 7 years ago and is trying to get his life back together and plans to go to rehabilitation program in [place].  Reportedly has had no blood work done.  Offered multiple tests, however strongly recommended at least a CMP. Patient stated that he will follow up and get this done.  Denies any auditory or visual hallucinations, having thoughts of harming himself or others and is alert and oriented 3.  - Reportedly stable, do not have access to [facility] documentation - Follow-up CMP

## 2016-06-07 NOTE — Assessment & Plan Note (Signed)
Physical exam was unremarkable. Do not have any labs on this patient. He does not have insurance, but would like to obtain lipid panel and hemoglobin A1c and CMP to assess whether there are any electrolyte abnormalities due to his long-term Abilify use.  I offered his labs this patient and he stated that he will come back to get these.  Additionally I recommended that he get a flu shot, and tetanus shot at his local pharmacy as this would be cheaper than in clinic and he can also go to the health department or [facility] to see if STD screening. - Follow-up CMP, hemoglobin A1c and lipid panel - Receive flu shot and tetanus shot at pharmacy - [facility] or health department

## 2016-11-08 ENCOUNTER — Ambulatory Visit: Payer: Self-pay | Admitting: Internal Medicine

## 2019-05-04 ENCOUNTER — Encounter: Payer: Self-pay | Admitting: Family Medicine

## 2019-08-31 ENCOUNTER — Encounter: Payer: Self-pay | Admitting: Family Medicine

## 2019-08-31 ENCOUNTER — Ambulatory Visit (INDEPENDENT_AMBULATORY_CARE_PROVIDER_SITE_OTHER): Payer: Self-pay | Admitting: Family Medicine

## 2019-08-31 ENCOUNTER — Other Ambulatory Visit: Payer: Self-pay

## 2019-08-31 VITALS — BP 100/70 | HR 94 | Ht 72.0 in | Wt 196.6 lb

## 2019-08-31 DIAGNOSIS — Z87898 Personal history of other specified conditions: Secondary | ICD-10-CM

## 2019-08-31 DIAGNOSIS — Z113 Encounter for screening for infections with a predominantly sexual mode of transmission: Secondary | ICD-10-CM

## 2019-08-31 DIAGNOSIS — F1290 Cannabis use, unspecified, uncomplicated: Secondary | ICD-10-CM | POA: Insufficient documentation

## 2019-08-31 DIAGNOSIS — F17200 Nicotine dependence, unspecified, uncomplicated: Secondary | ICD-10-CM

## 2019-08-31 DIAGNOSIS — F209 Schizophrenia, unspecified: Secondary | ICD-10-CM

## 2019-08-31 DIAGNOSIS — F1991 Other psychoactive substance use, unspecified, in remission: Secondary | ICD-10-CM

## 2019-08-31 NOTE — Assessment & Plan Note (Signed)
Roughly 17-pack-year history.  Currently smokes 1 pack/day.  Not interested in quitting today. -Continue to encourage cessation

## 2019-08-31 NOTE — Assessment & Plan Note (Signed)
Currently being treated by Tamela Oddi, NP with [facility].  His only medication at this time is olanzapine 10 mg daily. -Release of information filled out for [facility] -Continue olanzapine 10 mg daily

## 2019-08-31 NOTE — Progress Notes (Signed)
    Subjective:  Dylan Thompson is a 33 y.o. male who presents to the [facility] today to establish care.   HPI: Dylan Thompson was seen in the clinic today to establish care with a primary care physician.  He has a previous medical history significant for schizophrenia and has been followed closely by psychiatry.  Recently, he had blood work performed and was encouraged to establish with a primary care physician to discuss his cholesterol and for general ongoing care.  Schizophrenia He was diagnosed with schizophrenia as a young adult and has been treated well controlled for years.  He reports that he has previously been hospitalized for schizophrenia in 2001.  Since that time, he has been managed in the outpatient setting without issue.  He is currently seen by Tamela Oddi, NP at [facility].  His only medication at this time is olanzapine 10 mg.  Medical records were not available in his electronic chart for review.  Current smoker, nicotine He reports that he smokes 1 pack of cigarettes per day for the past 17 years.  He would like to cut down and potentially quit smoking this year.  He is not interested in a longer conversation at this time.  Current smoker, marijuana He is a daily smoker of marijuana.  He reports this is partially due to to self-medicating for anxiety although primarily for enjoyment.  He is not interested in cutting at this time.  Previous history of illicit drug use He reports a previous history of cocaine, heroin, methamphetamines.  His drug of choice used to be methamphetamines and he reports previous issues with addiction.  He reports that he has been drug-free for at least the past 3 years.  Social They currently lives alone and feels safe in that situation.  He is not currently sexually active.  Objective:  Physical Exam: BP 100/70   Pulse 94   Ht 6' (1.829 m)   Wt 196 lb 9.6 oz (89.2 kg)   SpO2 97%   BMI 26.66 kg/m    Gen: NAD, resting comfortably HEENT:  Normal oral mucosa with good dentition. CV: RRR with no murmurs appreciated Pulm: NWOB, CTAB with no crackles, wheezes, or rhonchi GI: Normal bowel sounds present. Soft, Nontender, Nondistended. MSK: no edema, cyanosis, or clubbing noted Skin: warm, dry, no concerning lesions of her abdomen, back. Neuro: grossly normal, moves all extremities Psych: Normal affect and thought content  No results found for this or any previous visit (from the past 72 hour(s)).   Assessment/Plan:  Schizophrenia, chronic condition (HCC) Currently being treated by Tamela Oddi, NP with [facility].  His only medication at this time is olanzapine 10 mg daily. -Release of information filled out for [facility] -Continue olanzapine 10 mg daily  Nicotine use disorder Roughly 17-pack-year history.  Currently smokes 1 pack/day.  Not interested in quitting today. -Continue to encourage cessation  Marijuana use -Advised on marijuana cessation  History of illicit drug use History of methamphetamines, cocaine, heroin.  Drug of choice methamphetamines.  Denies current use of illicit substances. -Follow-up HIV, hep C  Screening for STDs (sexually transmitted diseases) -Follow-up RPR .

## 2019-08-31 NOTE — Patient Instructions (Signed)
It is great to meet you today Mr. Dylan Thompson.  I will follow up with you regarding the results of today's blood work.  I will also let you know once I have had a chance to see the recent lab work from your psychiatrist.  Unless there is any issue, you do not need to follow-up with [place] for another year.

## 2019-08-31 NOTE — Assessment & Plan Note (Signed)
History of methamphetamines, cocaine, heroin.  Drug of choice methamphetamines.  Denies current use of illicit substances. -Follow-up HIV, hep C

## 2019-08-31 NOTE — Assessment & Plan Note (Signed)
-  Advised on marijuana cessation

## 2019-08-31 NOTE — Assessment & Plan Note (Signed)
-  Follow-up RPR

## 2019-09-01 ENCOUNTER — Encounter: Payer: Self-pay | Admitting: Family Medicine

## 2019-09-01 LAB — HIV ANTIBODY (ROUTINE TESTING W REFLEX): HIV Screen 4th Generation wRfx: NONREACTIVE

## 2019-09-01 LAB — HEPATITIS C ANTIBODY: Hep C Virus Ab: <0.1 s/co ratio (ref 0.0–0.9)

## 2019-09-01 LAB — RPR: RPR Ser Ql: NONREACTIVE

## 2019-09-01 NOTE — Addendum Note (Signed)
Addended by: Nada Libman on: 09/01/2019 10:56 AM   Modules accepted: Level of Service

## 2019-11-29 ENCOUNTER — Other Ambulatory Visit: Payer: Self-pay

## 2019-11-29 ENCOUNTER — Ambulatory Visit (INDEPENDENT_AMBULATORY_CARE_PROVIDER_SITE_OTHER): Payer: Self-pay | Admitting: Family Medicine

## 2019-11-29 ENCOUNTER — Encounter: Payer: Self-pay | Admitting: Family Medicine

## 2019-11-29 DIAGNOSIS — Z0189 Encounter for other specified special examinations: Secondary | ICD-10-CM

## 2019-11-29 NOTE — Patient Instructions (Signed)
It was great meeting you today!  Unfortunately do not have access to the lab work from your psychiatrist.  It does not appear that they send it to [place], after checking in multiple places.  I gave you a release of information form, I will fax it to that office and have them send them over and then give you a call once the records come back.  If for whatever reason I am unable to get the records or there is some issue with the records there is a possibility I may ask you to come back and get the blood work redrawn. I will charge you for today's visit.

## 2019-11-30 DIAGNOSIS — Z0189 Encounter for other specified special examinations: Secondary | ICD-10-CM | POA: Insufficient documentation

## 2019-11-30 NOTE — Progress Notes (Signed)
   CHIEF COMPLAINT / HPI: 34 year old male who presents for lab follow-up.  He was seen by a psychiatrist and had labs drawn.  He was told that his lipid panel and glucose were abnormal and that he should follow-up with his PCP.  He does not have the lab work with him, and it was not sent from his psychiatrist.  Obtained the release of information form from the patient which was sent to his psychiatrist's office.  Will update plan when those results become available.   OBJECTIVE: BP 100/60   Pulse 84   Ht 6' (1.829 m)   Wt 193 lb 4 oz (87.7 kg)   SpO2 98%   BMI 26.21 kg/m   Gen: Very pleasant 34 year old male, no acute distress, comfortable Rest of the exam was deferred due to the nature of the visit  ASSESSMENT / PLAN: We will attempt to get lab work from psychiatrist office, I did discuss with the patient that it may be necessary to repeat the lab work if this is all possible.  We will no charge for today's visit.   Myrene Buddy MD PGY-3 Family Medicine Resident [facility]

## 2020-09-13 ENCOUNTER — Other Ambulatory Visit (HOSPITAL_COMMUNITY): Payer: Self-pay | Admitting: Physician Assistant

## 2020-09-13 DIAGNOSIS — N63 Unspecified lump in unspecified breast: Secondary | ICD-10-CM

## 2020-09-19 ENCOUNTER — Ambulatory Visit (HOSPITAL_COMMUNITY)
Admission: RE | Admit: 2020-09-19 | Discharge: 2020-09-19 | Disposition: A | Discharge status: Home / Self Care | Payer: Self-pay | Source: Ambulatory Visit | Attending: Physician Assistant | Admitting: Physician Assistant

## 2020-09-19 ENCOUNTER — Other Ambulatory Visit: Payer: Self-pay

## 2020-09-19 DIAGNOSIS — N63 Unspecified lump in unspecified breast: Secondary | ICD-10-CM

## 2021-10-03 ENCOUNTER — Ambulatory Visit: Payer: Self-pay | Admitting: Internal Medicine

## 2022-01-08 ENCOUNTER — Encounter: Payer: Self-pay | Admitting: *Deleted

## 2022-08-10 IMAGING — MG DIGITAL DIAGNOSTIC BILAT W/ TOMO W/ CAD
6 of 10 series · 6 of 30 positions shown · non-contrast
Comparison: None.

CLINICAL DATA: Patient presents of left breast swelling and
tenderness for several, which she believes is related to a
medication.

EXAM:
DIGITAL DIAGNOSTIC BILATERAL MAMMOGRAM WITH TOMOSYNTHESIS AND CAD
TECHNIQUE: Bilateral digital diagnostic mammography and breast tomosynthesis
was performed. The images were evaluated with computer-aided
detection.

[L CC synth-2D]
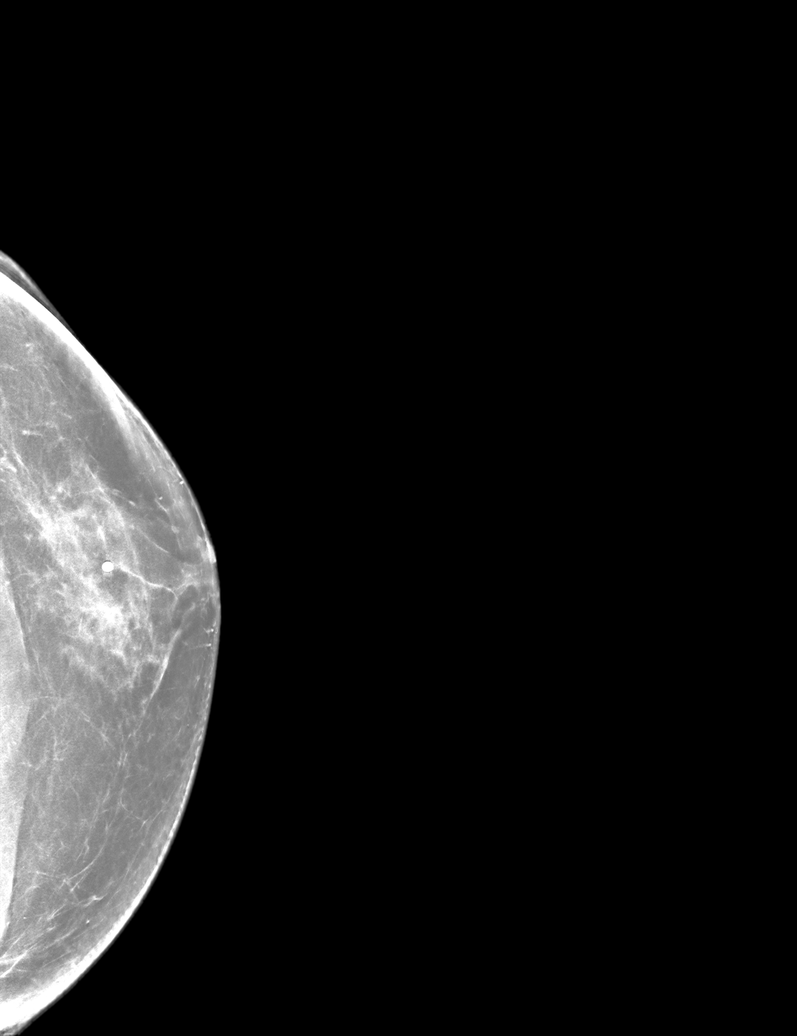

[R CC synth-2D]
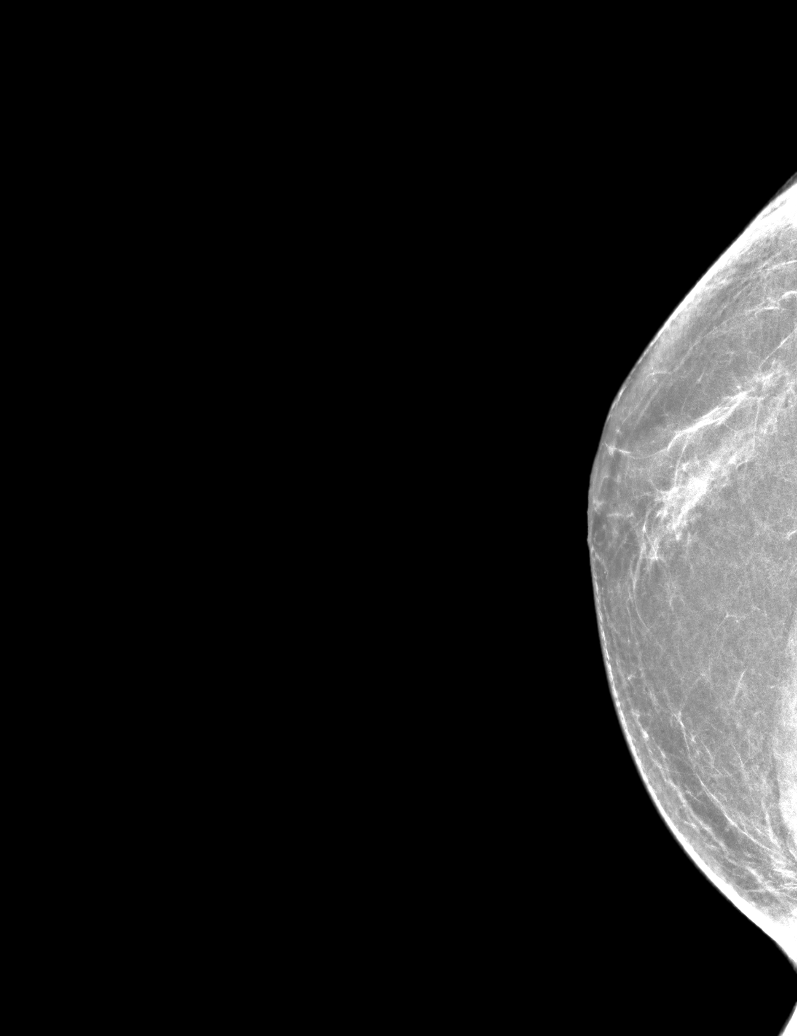

[R MLO synth-2D]
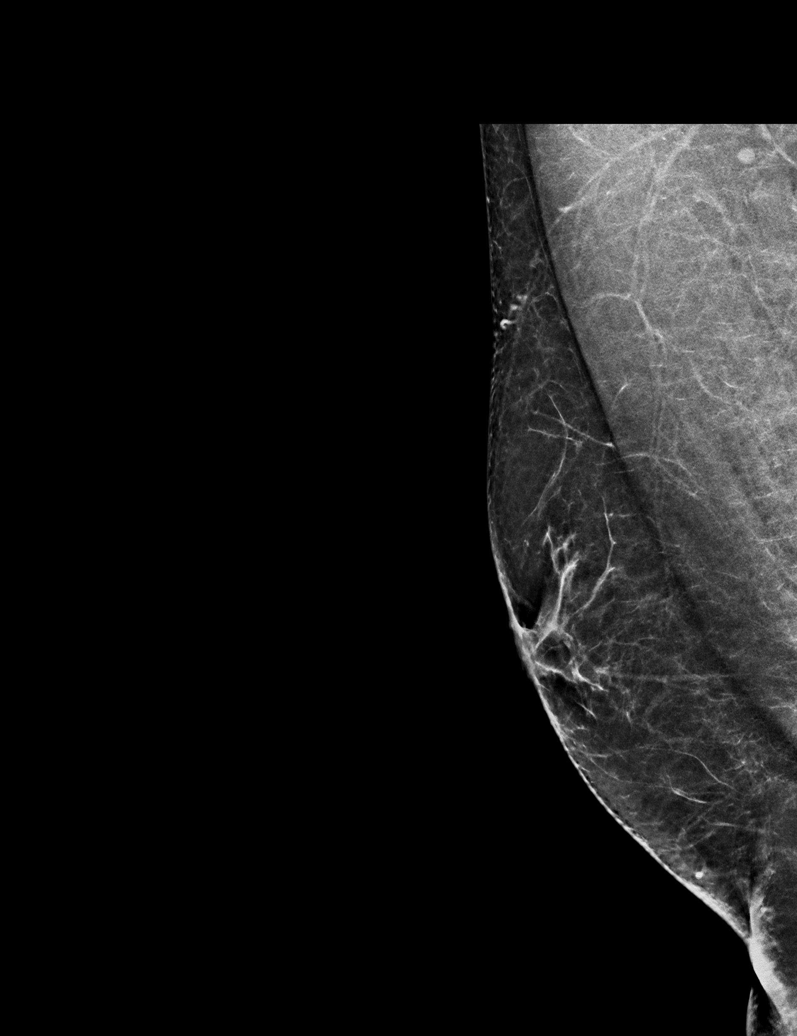

[L MLO synth-2D (1 of 2)]
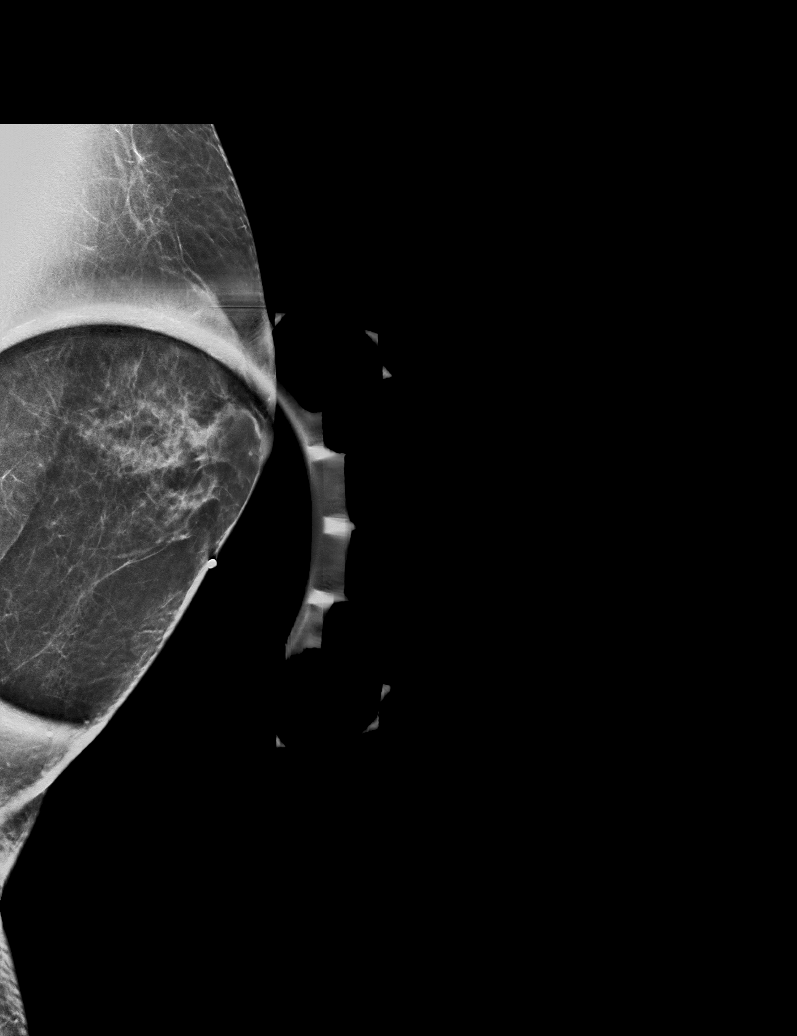

[L MLO synth-2D (2 of 2)]
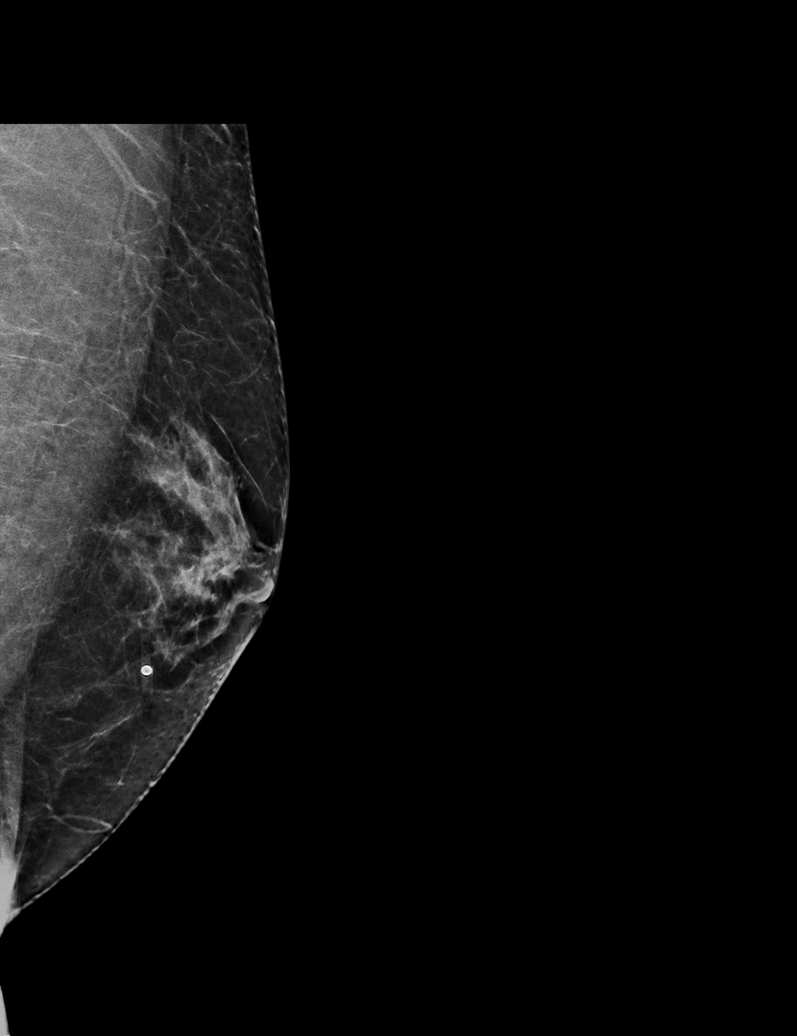

[L CC tomo · tomo slice 35/70.0]
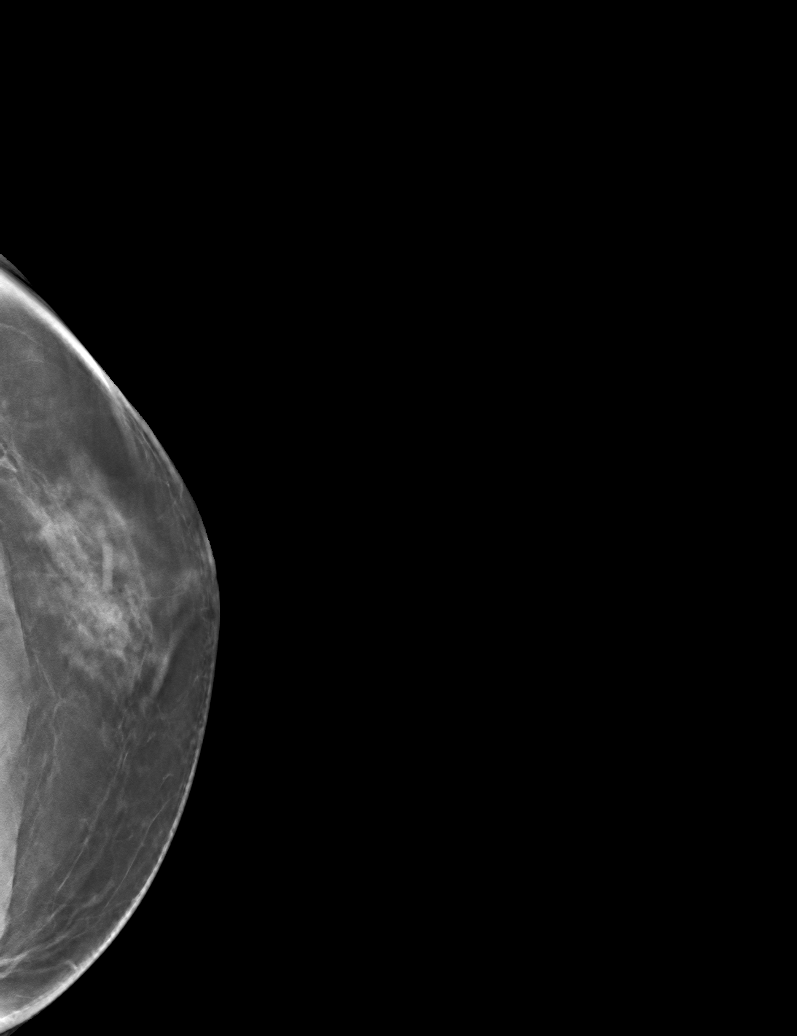

[6 of 30 positions shown; findings below may reference images not displayed]

ACR Breast Density Category b: There are scattered areas of
fibroglandular density.
FINDINGS: There is increased retroareolar density in both breasts, much more
notable on left, in a typical pattern gynecomastia. There are no
discrete masses, no areas of architectural distortion no suspicious
calcifications.

On exam, there is soft prominence of the retroareolar left breast
mild associated tenderness.
IMPRESSION: 1. Benign, bilateral, left greater than right, gynecomastia.

RECOMMENDATION:
1. Clinical follow-up for the benign gynecomastia. Breast
enlargement is a known side effect of the patient's current
medication, Olanzapine.

I have discussed the findings and recommendations with the patient.
If applicable, a reminder letter will be sent to the patient
regarding the next appointment.

BI-RADS CATEGORY  2: Benign.

## 2023-03-06 ENCOUNTER — Encounter: Payer: Self-pay | Admitting: Nurse Practitioner

## 2023-03-06 ENCOUNTER — Ambulatory Visit: Payer: Medicaid Other | Admitting: Nurse Practitioner

## 2023-03-06 VITALS — BP 104/64 | HR 77 | Temp 99.1°F | Ht 72.0 in | Wt 147.2 lb

## 2023-03-06 DIAGNOSIS — R591 Generalized enlarged lymph nodes: Secondary | ICD-10-CM

## 2023-03-06 DIAGNOSIS — R5383 Other fatigue: Secondary | ICD-10-CM | POA: Diagnosis not present

## 2023-03-06 DIAGNOSIS — R2231 Localized swelling, mass and lump, right upper limb: Secondary | ICD-10-CM | POA: Diagnosis not present

## 2023-03-06 DIAGNOSIS — W57XXXS Bitten or stung by nonvenomous insect and other nonvenomous arthropods, sequela: Secondary | ICD-10-CM | POA: Diagnosis not present

## 2023-03-06 DIAGNOSIS — K469 Unspecified abdominal hernia without obstruction or gangrene: Secondary | ICD-10-CM | POA: Insufficient documentation

## 2023-03-06 DIAGNOSIS — S40861S Insect bite (nonvenomous) of right upper arm, sequela: Secondary | ICD-10-CM | POA: Diagnosis not present

## 2023-03-06 DIAGNOSIS — S40861A Insect bite (nonvenomous) of right upper arm, initial encounter: Secondary | ICD-10-CM | POA: Insufficient documentation

## 2023-03-06 DIAGNOSIS — Z131 Encounter for screening for diabetes mellitus: Secondary | ICD-10-CM | POA: Insufficient documentation

## 2023-03-06 DIAGNOSIS — T148XXA Other injury of unspecified body region, initial encounter: Secondary | ICD-10-CM | POA: Insufficient documentation

## 2023-03-06 DIAGNOSIS — Z13220 Encounter for screening for lipoid disorders: Secondary | ICD-10-CM | POA: Insufficient documentation

## 2023-03-06 MED ORDER — MUPIROCIN 2 % EX OINT
1.0000 | TOPICAL_OINTMENT | Freq: Two times a day (BID) | CUTANEOUS | 2 refills | Status: DC
Start: 2023-03-06 — End: 2023-04-18

## 2023-03-06 NOTE — Assessment & Plan Note (Signed)
Hernia not obviously identified on exam today.  No signs or symptoms of strangulation identified.  Per patient request will refer to general surgery for further evaluation.

## 2023-03-06 NOTE — Assessment & Plan Note (Signed)
Labs ordered, further recommendations may be made based upon these results. 

## 2023-03-06 NOTE — Progress Notes (Signed)
New Patient Office Visit  Subjective    Patient ID: Dylan Thompson, male    DOB: 07-Sep-1985  Age: 37 y.o. MRN: [id number]  CC:  Chief Complaint  Patient presents with   Establish Care    Hernia , std test, hemroids, mold alergy?, bump under skin on arm. authritus. other stuff     HPI Brode Obenauer Abella Thompson presents to establish care.  His past medical history is significant for anxiety, depression, hyperlipidemia, and schizophrenia.  He reports that he does follow with his psychiatrist who is Dr. Kizzie Bane regularly.  He also reports that he has had an increase in hallucinations but denies any suicidal or homicidal ideation today.  Is scheduled to see psychiatry again later this month. He has multiple concerns as outlined below: Fatigue/Lymphadenopathy: Symptoms started about 2 months ago.  He does report 1 new sexual partner.  Enlarged lymph node is located to the left groin area.  Reports that he did go camping about 2 months ago and did pull a tick off of his left shoulder. Bug bite: Reports that he went to sleep last night and woke up with a red, swollen, pruritic lesion to his right upper thigh. Abdominal Hernia: Reports chronic history of hernia, can be uncomfortable at times but denies pain.  No issues with passing flatus or having bowel movements.  Would like referral to general surgery for discussion regarding treatment. Right arm mass: Has been present chronically, would like this to be evaluated.  Outpatient Encounter Medications as of 03/06/2023  Medication Sig   ARIPiprazole (ABILIFY) 10 MG tablet Take 10 mg by mouth daily.   mupirocin ointment (BACTROBAN) 2 % Apply 1 Application topically 2 (two) times daily.   OLANZapine (ZYPREXA) 10 MG tablet Take 10 mg by mouth at bedtime.   No facility-administered encounter medications on file as of 03/06/2023.    Past Medical History:  Diagnosis Date   Anxiety    Depression    Hyperlipidemia    Schizophrenia (HCC)     Past Surgical  History:  Procedure Laterality Date   FINGER FRACTURE SURGERY  2014    Family History  Problem Relation Age of Onset   Depression Mother    Depression Sister     Social History   Socioeconomic History   Marital status: Single    Spouse name: Not on file   Number of children: Not on file   Years of education: Not on file   Highest education level: Not on file  Occupational History   Not on file  Tobacco Use   Smoking status: Former    Current packs/day: 1.00    Average packs/day: 1 pack/day for 17.0 years (17.0 ttl pk-yrs)    Types: Cigarettes   Smokeless tobacco: Never  Substance and Sexual Activity   Alcohol use: Not Currently    Comment: occ   Drug use: Yes    Frequency: 7.0 times per week    Types: Marijuana   Sexual activity: Not Currently    Comment: last used 1 month ago  Other Topics Concern   Not on file  Social History Narrative   Not on file   Social Determinants of Health   Financial Resource Strain: Not on file  Food Insecurity: Not on file  Transportation Needs: Not on file  Physical Activity: Not on file  Stress: Not on file  Social Connections: Not on file  Intimate Partner Violence: Not on file    Review of Systems  Constitutional:  Positive for chills and malaise/fatigue. Negative for fever and weight loss.  Respiratory:  Positive for cough, shortness of breath and wheezing.   Cardiovascular:  Negative for chest pain and palpitations.  Gastrointestinal:  Positive for abdominal pain (mild). Negative for constipation, nausea and vomiting.       (+) anorexia  Genitourinary:  Negative for dysuria and hematuria.  Skin:  Positive for itching.  Neurological:  Negative for headaches.  Psychiatric/Behavioral:  Positive for hallucinations. Negative for depression and suicidal ideas. The patient is nervous/anxious.         Objective    BP 104/64 (BP Location: Left Arm, Patient Position: Sitting, Cuff Size: Normal)   Pulse 77   Temp 99.1 F  (37.3 C) (Oral)   Ht 6' (1.829 m)   Wt 147 lb 3.2 oz (66.8 kg)   SpO2 96%   BMI 19.96 kg/m   Physical Exam Vitals reviewed. Exam conducted with a chaperone present.  Constitutional:      Appearance: Normal appearance.  HENT:     Head: Normocephalic and atraumatic.  Cardiovascular:     Rate and Rhythm: Normal rate and regular rhythm.  Pulmonary:     Effort: Pulmonary effort is normal.     Breath sounds: Normal breath sounds.  Abdominal:     General: Abdomen is flat. Bowel sounds are normal. There is no distension.     Palpations: Abdomen is soft.     Tenderness: There is no abdominal tenderness.  Musculoskeletal:     Cervical back: Neck supple.  Lymphadenopathy:     Lower Body: Left inguinal adenopathy present.  Skin:    General: Skin is warm and dry.          Comments: Multiple scabs that seem consistent with bug bites.  Patient does report that he itches his legs frequently as well.  Area of concern is an indurated we will on the left upper thigh.  No skin breakdown, drainage, redness, warmth, or tenderness to touch identified.  Neurological:     Mental Status: He is alert and oriented to person, place, and time.  Psychiatric:        Mood and Affect: Mood normal.        Behavior: Behavior normal.        Thought Content: Thought content normal.        Judgment: Judgment normal.         Assessment & Plan:   Problem List Items Addressed This Visit       Musculoskeletal and Integument   Tick bite of right upper arm    Labs ordered, further recommendations may be made based upon these results       Relevant Orders   Lyme Disease Serology w/Reflex   CBC   Comprehensive metabolic panel     Immune and Lymphatic   Lymphadenopathy - Primary    New, ill-defined enlarged lymph node to left groin.  Will order ultrasound for further evaluation.  Labs also ordered today for further evaluation.      Relevant Orders   Lyme Disease Serology w/Reflex   CBC    Comprehensive metabolic panel   Chlamydia/Neisseria Gonorrhoeae RNA,TMA,Urogenital   HIV Antibody (routine testing w rflx)   RPR   C-reactive protein   Sedimentation rate   [place] LT LOWER EXTREM LTD SOFT TISSUE NON VASCULAR     Other   Fatigue    Labs ordered, further recommendations may be made based upon these results  Relevant Orders   Lyme Disease Serology w/Reflex   CBC   Comprehensive metabolic panel   TSH   Chlamydia/Neisseria Gonorrhoeae RNA,TMA,Urogenital   HIV Antibody (routine testing w rflx)   RPR   C-reactive protein   Sedimentation rate   Encounter for lipid screening for cardiovascular disease    Labs ordered, further recommendations may be made based upon these results       Relevant Orders   CBC   Comprehensive metabolic panel   Lipid panel   Diabetes mellitus screening    Labs ordered, further recommendations may be made based upon these results       Relevant Orders   CBC   Comprehensive metabolic panel   Hemoglobin A1c   Arm mass, right    Chronic, appears consistent with lipoma.  Ultrasound ordered for verification.      Relevant Orders   US SOFT TISSUE RT UPPER EXTREMITY LTD (NON-VASCULAR)   Bite    Etiology unclear, for now she was mupirocin ointment to prevent secondary bacterial infection.  Monitor area closely.  Patient encouraged to call office if symptoms persist or worsen.      Relevant Medications   mupirocin ointment (BACTROBAN) 2 %   Abdominal hernia without obstruction and without gangrene    Hernia not obviously identified on exam today.  No signs or symptoms of strangulation identified.  Per patient request will refer to general surgery for further evaluation.      Relevant Orders   Ambulatory referral to General Surgery    Return in about 1 month (around 04/06/2023) for F/U with Melody Cirrincione.   Elenore Paddy, NP

## 2023-03-06 NOTE — Assessment & Plan Note (Signed)
Chronic, appears consistent with lipoma.  Ultrasound ordered for verification.

## 2023-03-06 NOTE — Assessment & Plan Note (Signed)
Etiology unclear, for now she was mupirocin ointment to prevent secondary bacterial infection.  Monitor area closely.  Patient encouraged to call office if symptoms persist or worsen.

## 2023-03-06 NOTE — Assessment & Plan Note (Signed)
New, ill-defined enlarged lymph node to left groin.  Will order ultrasound for further evaluation.  Labs also ordered today for further evaluation.

## 2023-03-20 ENCOUNTER — Encounter (INDEPENDENT_AMBULATORY_CARE_PROVIDER_SITE_OTHER): Payer: Self-pay

## 2023-04-16 ENCOUNTER — Other Ambulatory Visit (INDEPENDENT_AMBULATORY_CARE_PROVIDER_SITE_OTHER): Payer: Medicaid Other

## 2023-04-16 DIAGNOSIS — Z131 Encounter for screening for diabetes mellitus: Secondary | ICD-10-CM | POA: Diagnosis not present

## 2023-04-16 DIAGNOSIS — R5383 Other fatigue: Secondary | ICD-10-CM | POA: Diagnosis not present

## 2023-04-16 DIAGNOSIS — Z136 Encounter for screening for cardiovascular disorders: Secondary | ICD-10-CM

## 2023-04-16 DIAGNOSIS — Z13220 Encounter for screening for lipoid disorders: Secondary | ICD-10-CM

## 2023-04-16 DIAGNOSIS — S40861S Insect bite (nonvenomous) of right upper arm, sequela: Secondary | ICD-10-CM | POA: Diagnosis not present

## 2023-04-16 DIAGNOSIS — W57XXXS Bitten or stung by nonvenomous insect and other nonvenomous arthropods, sequela: Secondary | ICD-10-CM

## 2023-04-16 DIAGNOSIS — R591 Generalized enlarged lymph nodes: Secondary | ICD-10-CM | POA: Diagnosis not present

## 2023-04-16 LAB — COMPREHENSIVE METABOLIC PANEL
ALT: 14 U/L (ref 0–53)
AST: 17 U/L (ref 0–37)
Albumin: 4.1 g/dL (ref 3.5–5.2)
Alkaline Phosphatase: 59 U/L (ref 39–117)
BUN: 16 mg/dL (ref 6–23)
CO2: 27 meq/L (ref 19–32)
Calcium: 9 mg/dL (ref 8.4–10.5)
Chloride: 106 meq/L (ref 96–112)
Creatinine, Ser: 1.09 mg/dL (ref 0.40–1.50)
GFR: 85.67 mL/min (ref >60.00)
Glucose, Bld: 86 mg/dL (ref 70–99)
Potassium: 3.6 meq/L (ref 3.5–5.1)
Sodium: 140 meq/L (ref 135–145)
Total Bilirubin: 0.6 mg/dL (ref 0.2–1.2)
Total Protein: 6.7 g/dL (ref 6.0–8.3)

## 2023-04-16 LAB — CBC
HCT: 44.3 % (ref 39.0–52.0)
Hemoglobin: 14.9 g/dL (ref 13.0–17.0)
MCHC: 33.7 g/dL (ref 30.0–36.0)
MCV: 86 fl (ref 78.0–100.0)
Platelets: 279 10*3/uL (ref 150.0–400.0)
RBC: 5.16 Mil/uL (ref 4.22–5.81)
RDW: 13 % (ref 11.5–15.5)
WBC: 7.5 10*3/uL (ref 4.0–10.5)

## 2023-04-16 LAB — LIPID PANEL
Cholesterol: 163 mg/dL (ref 0–200)
HDL: 61.4 mg/dL (ref >39.00)
LDL Cholesterol: 91 mg/dL (ref 0–99)
NonHDL: 102.08
Total CHOL/HDL Ratio: 3
Triglycerides: 54 mg/dL (ref 0.0–149.0)
VLDL: 10.8 mg/dL (ref 0.0–40.0)

## 2023-04-16 LAB — C-REACTIVE PROTEIN: CRP: <1 mg/dL (ref 0.5–20.0)

## 2023-04-16 LAB — TSH: TSH: 1.69 u[IU]/mL (ref 0.35–5.50)

## 2023-04-16 LAB — SEDIMENTATION RATE: Sed Rate: 6 mm/h (ref 0–15)

## 2023-04-16 LAB — HEMOGLOBIN A1C: Hgb A1c MFr Bld: 5.4 % (ref 4.6–6.5)

## 2023-04-17 ENCOUNTER — Ambulatory Visit: Payer: Medicaid Other | Admitting: Nurse Practitioner

## 2023-04-17 LAB — RPR: RPR Ser Ql: NONREACTIVE

## 2023-04-17 LAB — LYME DISEASE SEROLOGY W/REFLEX: Lyme Total Antibody EIA: NEGATIVE

## 2023-04-17 LAB — HIV ANTIBODY (ROUTINE TESTING W REFLEX): HIV 1&2 Ab, 4th Generation: NONREACTIVE

## 2023-04-18 ENCOUNTER — Ambulatory Visit: Payer: Medicaid Other | Admitting: Nurse Practitioner

## 2023-04-18 ENCOUNTER — Telehealth: Payer: Self-pay | Admitting: Nurse Practitioner

## 2023-04-18 VITALS — BP 96/62 | HR 61 | Temp 98.4°F | Ht 72.0 in | Wt 154.2 lb

## 2023-04-18 DIAGNOSIS — L600 Ingrowing nail: Secondary | ICD-10-CM | POA: Diagnosis not present

## 2023-04-18 DIAGNOSIS — N489 Disorder of penis, unspecified: Secondary | ICD-10-CM | POA: Diagnosis not present

## 2023-04-18 DIAGNOSIS — F209 Schizophrenia, unspecified: Secondary | ICD-10-CM | POA: Diagnosis not present

## 2023-04-18 NOTE — Assessment & Plan Note (Signed)
Bilateral great toes do appear to have ingrown toenails, no infection noted today. Referral to podiatry made today.

## 2023-04-18 NOTE — Assessment & Plan Note (Signed)
No active lesion currently, however patient reports history of lesion.  Per his request will check HSV antibodies.  Further recommendations may be made based upon these results.

## 2023-04-18 NOTE — Progress Notes (Signed)
Established Patient Office Visit  Subjective   Patient ID: Dylan Thompson, male    DOB: 11-Jun-1986  Age: 38 y.o. MRN: [id number]  Chief Complaint  Patient presents with   Medical Management of Chronic Issues    Talk about ingrown toe nail,     Patient has here for follow-up. Reports that he has ingrown toenails to bilateral great toes.  He would like referral to podiatry. Also reports that his Abilify dose was adjusted last time he saw his psychiatrist and that his mood is much improved since making this change. He reports that he thinks he may have had a genital herpes outbreak a few years ago versus having had bug bites while camping.  He would like to have herpes antibodies tested. Here to discuss his other lab results from a few days ago.    Review of Systems  Cardiovascular:  Negative for chest pain.  Skin:        (+) ingrown toenail  Psychiatric/Behavioral:  Negative for depression and suicidal ideas.       Objective:     BP 96/62   Pulse 61   Temp 98.4 F (36.9 C) (Temporal)   Ht 6' (1.829 m)   Wt 154 lb 4 oz (70 kg)   SpO2 98%   BMI 20.92 kg/m    Physical Exam Vitals reviewed.  Constitutional:      Appearance: Normal appearance.  HENT:     Head: Normocephalic and atraumatic.  Cardiovascular:     Rate and Rhythm: Normal rate and regular rhythm.     Pulses:          Dorsalis pedis pulses are 2+ on the right side and 2+ on the left side.  Pulmonary:     Effort: Pulmonary effort is normal.     Breath sounds: Normal breath sounds.  Musculoskeletal:     Cervical back: Neck supple.  Feet:     Right foot:     Toenail Condition: Right toenails are ingrown.     Left foot:     Toenail Condition: Left toenails are ingrown.  Skin:    General: Skin is warm and dry.  Neurological:     Mental Status: He is alert and oriented to person, place, and time.  Psychiatric:        Mood and Affect: Mood normal.        Behavior: Behavior normal.        Thought  Content: Thought content normal.        Judgment: Judgment normal.         03/06/2023    2:11 PM 11/29/2019    4:30 PM 08/31/2019    3:10 PM  PHQ9 SCORE ONLY  PHQ-9 Total Score 19 0 0     No results found for any visits on 04/18/23.    The ASCVD Risk score (Arnett DK, et al., 2019) failed to calculate for the following reasons:   The 2019 ASCVD risk score is only valid for ages 86 to 40    Assessment & Plan:   Problem List Items Addressed This Visit       Musculoskeletal and Integument   Ingrown toenail of both feet - Primary    Bilateral great toes do appear to have ingrown toenails, no infection noted today. Referral to podiatry made today.      Relevant Orders   Ambulatory referral to Podiatry     Genitourinary   Penile lesion    No  active lesion currently, however patient reports history of lesion.  Per his request will check HSV antibodies.  Further recommendations may be made based upon these results.      Relevant Orders   HSV(herpes simplex vrs) 1+2 ab-IgG     Other   Schizophrenia, chronic condition (HCC)    Chronic, stable.  Mood seems to also have improved with adjustment in Abilify. Patient encouraged to follow-up with psychiatry as scheduled.       Return in about 6 months (around 10/16/2023) for CPE with Maralyn Sago.    Elenore Paddy, NP

## 2023-04-18 NOTE — Telephone Encounter (Signed)
Patient has not heard from scheduling for his ultrasounds that were ordered on 03/06/23. Is there a phone number he can call or can we ask scheduling to reach out to him?

## 2023-04-18 NOTE — Assessment & Plan Note (Signed)
Chronic, stable.  Mood seems to also have improved with adjustment in Abilify. Patient encouraged to follow-up with psychiatry as scheduled.

## 2023-04-19 LAB — CHLAMYDIA/NEISSERIA GONORRHOEAE RNA,TMA,UROGENTIAL
C. trachomatis RNA, TMA: NOT DETECTED
N. gonorrhoeae RNA, TMA: NOT DETECTED

## 2023-05-12 ENCOUNTER — Ambulatory Visit
Admission: RE | Admit: 2023-05-12 | Discharge: 2023-05-12 | Disposition: A | Discharge status: Home / Self Care | Payer: Medicaid Other | Source: Ambulatory Visit | Attending: Nurse Practitioner | Admitting: Nurse Practitioner

## 2023-05-12 DIAGNOSIS — R2231 Localized swelling, mass and lump, right upper limb: Secondary | ICD-10-CM

## 2023-05-12 DIAGNOSIS — R591 Generalized enlarged lymph nodes: Secondary | ICD-10-CM

## 2023-06-17 NOTE — H&P (View-Only) (Signed)
 Patient ID: Dylan Thompson, male   DOB: 21-May-1986, 37 y.o.   MRN: [id number]  Chief Complaint: Umbilical hernia, left groin bulge.  History of Present Illness Dylan Thompson is a 37 y.o. male with minimally symptomatic hernias of the supraumbilical area, in the left groin.  Umbilical hernia has been present for about 10 years at least, has never been painful.  Just got along with it very well.  Slight increase in size recently, minimal pain/tenderness.  No real pain in the left groin at all but bulge has increased in the left groin over the last year.  Past Medical History Past Medical History:  Diagnosis Date   Anxiety    Depression    Hyperlipidemia    Schizophrenia (HCC)       Past Surgical History:  Procedure Laterality Date   FINGER FRACTURE SURGERY  2014    No Known Allergies  Current Outpatient Medications  Medication Sig Dispense Refill   ARIPiprazole (ABILIFY) 10 MG tablet Take 10 mg by mouth daily. Pt is taking 15mg      No current facility-administered medications for this visit.    Family History Family History  Problem Relation Age of Onset   Depression Mother    Depression Sister       Social History Social History   Tobacco Use   Smoking status: Former    Current packs/day: 1.00    Average packs/day: 1 pack/day for 17.0 years (17.0 ttl pk-yrs)    Types: Cigarettes    Passive exposure: Past   Smokeless tobacco: Never  Vaping Use   Vaping status: Every Day  Substance Use Topics   Alcohol use: Not Currently    Comment: occ   Drug use: Not Currently    Frequency: 7.0 times per week    Types: Marijuana        Review of Systems  Constitutional: Negative.   HENT: Negative.    Eyes: Negative.   Respiratory: Negative.    Cardiovascular: Negative.   Gastrointestinal: Negative.   Genitourinary: Negative.   Skin: Negative.   Neurological: Negative.   Psychiatric/Behavioral: Negative.       Physical Exam Blood pressure 105/70, pulse 62,  temperature 98 F (36.7 C), height 5\' 11"  (1.803 m), weight 158 lb (71.7 kg), SpO2 99%. Last Weight  Most recent update: 06/19/2023 10:37 AM    Weight  71.7 kg (158 lb)             CONSTITUTIONAL: Well developed, and nourished, appropriately responsive and aware without distress.   EYES: Sclera non-icteric.   EARS, NOSE, MOUTH AND THROAT:  The oropharynx is clear. Oral mucosa is pink and moist.    Hearing is intact to voice.  NECK: Trachea is midline, and there is no jugular venous distension.  LYMPH NODES:  Lymph nodes in the neck are not appreciated. RESPIRATORY:  Lungs are clear, and breath sounds are equal bilaterally.  Normal respiratory effort without pathologic use of accessory muscles. CARDIOVASCULAR: Heart is regular in rate and rhythm.   Well perfused.  GI: The abdomen is small supraumbilical defect/preperitoneal fatty protrusion.  Otherwise soft, nontender, and nondistended. There were no palpable masses.  I did not appreciate hepatosplenomegaly.  GU: Left inguinal hernia with Valsalva, no appreciable right inguinal hernia.  Testes descended. MUSCULOSKELETAL:  Symmetrical muscle tone appreciated in all four extremities.    SKIN: Skin turgor is normal. No pathologic skin lesions appreciated.  NEUROLOGIC:  Motor and sensation appear grossly normal.  Cranial  nerves are grossly without defect. PSYCH:  Alert and oriented to person, place and time. Affect is appropriate for situation.  Data Reviewed I have personally reviewed what is currently available of the patient's imaging, recent labs and medical records.   Labs:     Latest Ref Rng & Units 04/16/2023    9:08 AM 11/11/2013   10:48 AM 11/11/2013   10:10 AM  CBC  WBC 4.0 - 10.5 K/uL 7.5   12.9   Hemoglobin 13.0 - 17.0 g/dL 40.9  81.1  91.4   Hematocrit 39.0 - 52.0 % 44.3  50.0  46.9   Platelets 150.0 - 400.0 K/uL 279.0   323       Latest Ref Rng & Units 04/16/2023    9:08 AM 11/11/2013   10:48 AM  CMP  Glucose 70 - 99  mg/dL 86  782   BUN 6 - 23 mg/dL 16  8   Creatinine 9.56 - 1.50 mg/dL 2.13  0.86   Sodium 578 - 145 mEq/L 140  141   Potassium 3.5 - 5.1 mEq/L 3.6  3.2   Chloride 96 - 112 mEq/L 106  100   CO2 19 - 32 mEq/L 27    Calcium 8.4 - 10.5 mg/dL 9.0    Total Protein 6.0 - 8.3 g/dL 6.7    Total Bilirubin 0.2 - 1.2 mg/dL 0.6    Alkaline Phos 39 - 117 U/L 59    AST 0 - 37 U/L 17    ALT 0 - 53 U/L 14      Imaging: Radiological images reviewed:  CLINICAL DATA:  Groin adenopathy versus hernia, pain for a year.   EXAM: ULTRASOUND OF Left GROIN SOFT TISSUES   TECHNIQUE: Ultrasound examination of the groin soft tissues was performed in the area of clinical concern.   COMPARISON:  None Available.   FINDINGS: Inguinal hernia is evident with Valsalva when a portion of omental fat is noted to enter the left inguinal canal. No herniated bowel loops. No masses or fluid collections.   IMPRESSION: Left inguinal hernia with the appearance most consistent with omental fat. The extent of this process and the contents of the hernia can be more definitively assessed with CT, if indicated.     Electronically Signed   By: Layla Maw M.D.   On: 06/03/2023 11:46   Assessment     Patient Active Problem List   Diagnosis Date Noted   Left inguinal hernia 06/19/2023   Epigastric hernia 06/19/2023   Ingrown toenail of both feet 04/18/2023   Penile lesion 04/18/2023   Lymphadenopathy 03/06/2023   Tick bite of right upper arm 03/06/2023   Fatigue 03/06/2023   Encounter for lipid screening for cardiovascular disease 03/06/2023   Diabetes mellitus screening 03/06/2023   Arm mass, right 03/06/2023   Bite 03/06/2023   Abdominal hernia without obstruction and without gangrene 03/06/2023   Laboratory test 11/30/2019   Nicotine use disorder 08/31/2019   Marijuana use 08/31/2019   History of illicit drug use 08/31/2019   Screening for STDs (sexually transmitted diseases) 08/31/2019   Umbilical  hernia without obstruction and without gangrene 03/20/2016   Fracture of finger, middle phalanx, closed 12/17/2010   Schizophrenia, chronic condition (HCC) 07/24/2010    Plan    Robotic repair of left inguinal hernia, possible right, and open versus laparoscopic repair of epigastric hernia, less than 3 cm defect.  I discussed possibility of incarceration, strangulation, enlargement in size over time, and the need for emergency  surgery in the face of these.  Also reviewed the techniques of reduction should incarceration occur, and when unsuccessful to present to the ED.  Also discussed that surgery risks include recurrence which can be up to 30% in the case of complex hernias, use of prosthetic materials (mesh) and the increased risk of infection and the possible need for re-operation and removal of mesh, possibility of post-op SBO or ileus, and the risks of general anesthetic including heart attack, stroke, sudden death or some reaction to anesthetic medications. The patient, and those present, appear to understand the risks, any and all questions were answered to the patient's satisfaction.  No guarantees were ever expressed or implied.  Face-to-face time spent with the patient and accompanying care providers(if present) was 30 minutes, with more than 50% of the time spent counseling, educating, and coordinating care of the patient.    These notes generated with voice recognition software. I apologize for typographical errors.  Campbell Lerner M.D., FACS 06/19/2023, 11:21 AM

## 2023-06-17 NOTE — Progress Notes (Unsigned)
Patient ID: Dylan Thompson, male   DOB: 21-May-1986, 37 y.o.   MRN: [id number]  Chief Complaint: Umbilical hernia, left groin bulge.  History of Present Illness Dylan Thompson is a 37 y.o. male with minimally symptomatic hernias of the supraumbilical area, in the left groin.  Umbilical hernia has been present for about 10 years at least, has never been painful.  Just got along with it very well.  Slight increase in size recently, minimal pain/tenderness.  No real pain in the left groin at all but bulge has increased in the left groin over the last year.  Past Medical History Past Medical History:  Diagnosis Date   Anxiety    Depression    Hyperlipidemia    Schizophrenia (HCC)       Past Surgical History:  Procedure Laterality Date   FINGER FRACTURE SURGERY  2014    No Known Allergies  Current Outpatient Medications  Medication Sig Dispense Refill   ARIPiprazole (ABILIFY) 10 MG tablet Take 10 mg by mouth daily. Pt is taking 15mg      No current facility-administered medications for this visit.    Family History Family History  Problem Relation Age of Onset   Depression Mother    Depression Sister       Social History Social History   Tobacco Use   Smoking status: Former    Current packs/day: 1.00    Average packs/day: 1 pack/day for 17.0 years (17.0 ttl pk-yrs)    Types: Cigarettes    Passive exposure: Past   Smokeless tobacco: Never  Vaping Use   Vaping status: Every Day  Substance Use Topics   Alcohol use: Not Currently    Comment: occ   Drug use: Not Currently    Frequency: 7.0 times per week    Types: Marijuana        Review of Systems  Constitutional: Negative.   HENT: Negative.    Eyes: Negative.   Respiratory: Negative.    Cardiovascular: Negative.   Gastrointestinal: Negative.   Genitourinary: Negative.   Skin: Negative.   Neurological: Negative.   Psychiatric/Behavioral: Negative.       Physical Exam Blood pressure 105/70, pulse 62,  temperature 98 F (36.7 C), height 5\' 11"  (1.803 m), weight 158 lb (71.7 kg), SpO2 99%. Last Weight  Most recent update: 06/19/2023 10:37 AM    Weight  71.7 kg (158 lb)             CONSTITUTIONAL: Well developed, and nourished, appropriately responsive and aware without distress.   EYES: Sclera non-icteric.   EARS, NOSE, MOUTH AND THROAT:  The oropharynx is clear. Oral mucosa is pink and moist.    Hearing is intact to voice.  NECK: Trachea is midline, and there is no jugular venous distension.  LYMPH NODES:  Lymph nodes in the neck are not appreciated. RESPIRATORY:  Lungs are clear, and breath sounds are equal bilaterally.  Normal respiratory effort without pathologic use of accessory muscles. CARDIOVASCULAR: Heart is regular in rate and rhythm.   Well perfused.  GI: The abdomen is small supraumbilical defect/preperitoneal fatty protrusion.  Otherwise soft, nontender, and nondistended. There were no palpable masses.  I did not appreciate hepatosplenomegaly.  GU: Left inguinal hernia with Valsalva, no appreciable right inguinal hernia.  Testes descended. MUSCULOSKELETAL:  Symmetrical muscle tone appreciated in all four extremities.    SKIN: Skin turgor is normal. No pathologic skin lesions appreciated.  NEUROLOGIC:  Motor and sensation appear grossly normal.  Cranial  nerves are grossly without defect. PSYCH:  Alert and oriented to person, place and time. Affect is appropriate for situation.  Data Reviewed I have personally reviewed what is currently available of the patient's imaging, recent labs and medical records.   Labs:     Latest Ref Rng & Units 04/16/2023    9:08 AM 11/11/2013   10:48 AM 11/11/2013   10:10 AM  CBC  WBC 4.0 - 10.5 K/uL 7.5   12.9   Hemoglobin 13.0 - 17.0 g/dL 40.9  81.1  91.4   Hematocrit 39.0 - 52.0 % 44.3  50.0  46.9   Platelets 150.0 - 400.0 K/uL 279.0   323       Latest Ref Rng & Units 04/16/2023    9:08 AM 11/11/2013   10:48 AM  CMP  Glucose 70 - 99  mg/dL 86  782   BUN 6 - 23 mg/dL 16  8   Creatinine 9.56 - 1.50 mg/dL 2.13  0.86   Sodium 578 - 145 mEq/L 140  141   Potassium 3.5 - 5.1 mEq/L 3.6  3.2   Chloride 96 - 112 mEq/L 106  100   CO2 19 - 32 mEq/L 27    Calcium 8.4 - 10.5 mg/dL 9.0    Total Protein 6.0 - 8.3 g/dL 6.7    Total Bilirubin 0.2 - 1.2 mg/dL 0.6    Alkaline Phos 39 - 117 U/L 59    AST 0 - 37 U/L 17    ALT 0 - 53 U/L 14      Imaging: Radiological images reviewed:  CLINICAL DATA:  Groin adenopathy versus hernia, pain for a year.   EXAM: ULTRASOUND OF Left GROIN SOFT TISSUES   TECHNIQUE: Ultrasound examination of the groin soft tissues was performed in the area of clinical concern.   COMPARISON:  None Available.   FINDINGS: Inguinal hernia is evident with Valsalva when a portion of omental fat is noted to enter the left inguinal canal. No herniated bowel loops. No masses or fluid collections.   IMPRESSION: Left inguinal hernia with the appearance most consistent with omental fat. The extent of this process and the contents of the hernia can be more definitively assessed with CT, if indicated.     Electronically Signed   By: Layla Maw M.D.   On: 06/03/2023 11:46   Assessment     Patient Active Problem List   Diagnosis Date Noted   Left inguinal hernia 06/19/2023   Epigastric hernia 06/19/2023   Ingrown toenail of both feet 04/18/2023   Penile lesion 04/18/2023   Lymphadenopathy 03/06/2023   Tick bite of right upper arm 03/06/2023   Fatigue 03/06/2023   Encounter for lipid screening for cardiovascular disease 03/06/2023   Diabetes mellitus screening 03/06/2023   Arm mass, right 03/06/2023   Bite 03/06/2023   Abdominal hernia without obstruction and without gangrene 03/06/2023   Laboratory test 11/30/2019   Nicotine use disorder 08/31/2019   Marijuana use 08/31/2019   History of illicit drug use 08/31/2019   Screening for STDs (sexually transmitted diseases) 08/31/2019   Umbilical  hernia without obstruction and without gangrene 03/20/2016   Fracture of finger, middle phalanx, closed 12/17/2010   Schizophrenia, chronic condition (HCC) 07/24/2010    Plan    Robotic repair of left inguinal hernia, possible right, and open versus laparoscopic repair of epigastric hernia, less than 3 cm defect.  I discussed possibility of incarceration, strangulation, enlargement in size over time, and the need for emergency  surgery in the face of these.  Also reviewed the techniques of reduction should incarceration occur, and when unsuccessful to present to the ED.  Also discussed that surgery risks include recurrence which can be up to 30% in the case of complex hernias, use of prosthetic materials (mesh) and the increased risk of infection and the possible need for re-operation and removal of mesh, possibility of post-op SBO or ileus, and the risks of general anesthetic including heart attack, stroke, sudden death or some reaction to anesthetic medications. The patient, and those present, appear to understand the risks, any and all questions were answered to the patient's satisfaction.  No guarantees were ever expressed or implied.  Face-to-face time spent with the patient and accompanying care providers(if present) was 30 minutes, with more than 50% of the time spent counseling, educating, and coordinating care of the patient.    These notes generated with voice recognition software. I apologize for typographical errors.  Campbell Lerner M.D., FACS 06/19/2023, 11:21 AM

## 2023-06-19 ENCOUNTER — Ambulatory Visit: Payer: MEDICAID | Admitting: Surgery

## 2023-06-19 ENCOUNTER — Ambulatory Visit: Payer: Self-pay | Admitting: Surgery

## 2023-06-19 ENCOUNTER — Telehealth: Payer: Self-pay | Admitting: Surgery

## 2023-06-19 ENCOUNTER — Encounter: Payer: Self-pay | Admitting: Surgery

## 2023-06-19 VITALS — BP 105/70 | HR 62 | Temp 98.0°F | Ht 71.0 in | Wt 158.0 lb

## 2023-06-19 DIAGNOSIS — K439 Ventral hernia without obstruction or gangrene: Secondary | ICD-10-CM | POA: Insufficient documentation

## 2023-06-19 DIAGNOSIS — K4090 Unilateral inguinal hernia, without obstruction or gangrene, not specified as recurrent: Secondary | ICD-10-CM

## 2023-06-19 NOTE — Patient Instructions (Signed)
You have chose to have your hernias repaired. This will be done by Dr. Claudine Mouton at [facility].  Please see your (blue) Pre-care information that you have been given today. Our surgery scheduler will call you to verify surgery date and to go over information.   You will need to arrange to be out of work for approximately 1-2 weeks and then you may return with a lifting restriction for 4 more weeks. If you have FMLA or Disability paperwork that needs to be filled out, please have your company fax your paperwork to [phone] or you may drop this by either office. This paperwork will be filled out within 3 days after your surgery has been completed.  You may have a bruise in your groin and also swelling and brusing in your testicle area. You may use ice 4-5 times daily for 15-20 minutes each time. Make sure that you place a barrier between you and the ice pack. To decrease the swelling, you may roll up a bath towel and place it vertically in between your thighs with your testicles resting on the towel. You will want to keep this area elevated as much as possible for several days following surgery.     Umbilical Hernia, Adult A hernia is a bulge of tissue that pushes through an opening between muscles. An umbilical hernia happens in the abdomen, near the belly button (umbilicus). The hernia may contain tissues from the small intestine, large intestine, or fatty tissue covering the intestines (omentum). Umbilical hernias in adults tend to get worse over time, and they require surgical treatment. There are several types of umbilical hernias. You may have: A hernia located just above or below the umbilicus (indirect hernia). This is the most common type of umbilical hernia in adults. A hernia that forms through an opening formed by the umbilicus (direct hernia). A hernia that comes and goes (reducible hernia). A reducible hernia may be visible only when you strain, lift something heavy, or cough.  This type of hernia can be pushed back into the abdomen (reduced). A hernia that traps abdominal tissue inside the hernia (incarcerated hernia). This type of hernia cannot be reduced. A hernia that cuts off blood flow to the tissues inside the hernia (strangulated hernia). The tissues can start to die if this happens. This type of hernia requires emergency treatment.  What are the causes? An umbilical hernia happens when tissue inside the abdomen presses on a weak area of the abdominal muscles. What increases the risk? You may have a greater risk of this condition if you: Are obese. Have had several pregnancies. Have a buildup of fluid inside your abdomen (ascites). Have had surgery that weakens the abdominal muscles.    Inguinal Hernia, Adult Muscles help keep everything in the body in its proper place. But if a weak spot in the muscles develops, something can poke through. That is called a hernia. When this happens in the lower part of the belly (abdomen), it is called an inguinal hernia. (It takes its name from a part of the body in this region called the inguinal canal.) A weak spot in the wall of muscles lets some fat or part of the small intestine bulge through. An inguinal hernia can develop at any age. Men get them more often than women. CAUSES  In adults, an inguinal hernia develops over time. It can be triggered by: Suddenly straining the muscles of the lower abdomen. Lifting heavy objects. Straining to have a bowel movement. Difficult  bowel movements (constipation) can lead to this. Constant coughing. This may be caused by smoking or lung disease. Being overweight. Being pregnant. Working at a job that requires long periods of standing or heavy lifting. Having had an inguinal hernia before. One type can be an emergency situation. It is called a strangulated inguinal hernia. It develops if part of the small intestine slips through the weak spot and cannot get back into the  abdomen. The blood supply can be cut off. If that happens, part of the intestine may die. This situation requires emergency surgery. SYMPTOMS  Often, a small inguinal hernia has no symptoms. It is found when a healthcare provider does a physical exam. Larger hernias usually have symptoms.  In adults, symptoms may include: A lump in the groin. This is easier to see when the person is standing. It might disappear when lying down. In men, a lump in the scrotum. Pain or burning in the groin. This occurs especially when lifting, straining or coughing. A dull ache or feeling of pressure in the groin. Signs of a strangulated hernia can include: A bulge in the groin that becomes very painful and tender to the touch. A bulge that turns red or purple. Fever, nausea and vomiting. Inability to have a bowel movement or to pass gas. DIAGNOSIS  To decide if you have an inguinal hernia, a healthcare provider will probably do a physical examination. This will include asking questions about any symptoms you have noticed. The healthcare provider might feel the groin area and ask you to cough. If an inguinal hernia is felt, the healthcare provider may try to slide it back into the abdomen. Usually no other tests are needed. TREATMENT  Treatments can vary. The size of the hernia makes a difference. Options include: Watchful waiting. This is often suggested if the hernia is small and you have had no symptoms. No medical procedure will be done unless symptoms develop. You will need to watch closely for symptoms. If any occur, contact your healthcare provider right away. Surgery. This is used if the hernia is larger or you have symptoms. Open surgery. This is usually an outpatient procedure (you will not stay overnight in a hospital). An cut (incision) is made through the skin in the groin. The hernia is put back inside the abdomen. The weak area in the muscles is then repaired by herniorrhaphy or hernioplasty.  Herniorrhaphy: in this type of surgery, the weak muscles are sewn back together. Hernioplasty: a patch or mesh is used to close the weak area in the abdominal wall. Laparoscopy. In this procedure, a surgeon makes small incisions. A thin tube with a tiny video camera (called a laparoscope) is put into the abdomen. The surgeon repairs the hernia with mesh by looking with the video camera and using two long instruments. HOME CARE INSTRUCTIONS  After surgery to repair an inguinal hernia: You will need to take pain medicine prescribed by your healthcare provider. Follow all directions carefully. You will need to take care of the wound from the incision. Your activity will be restricted for awhile. This will probably include no heavy lifting for several weeks. You also should not do anything too active for a few weeks. When you can return to work will depend on the type of job that you have. During "watchful waiting" periods, you should: Maintain a healthy weight. Eat a diet high in fiber (fruits, vegetables and whole grains). Drink plenty of fluids to avoid constipation. This means drinking enough water and  other liquids to keep your urine clear or pale yellow. Do not lift heavy objects. Do not stand for long periods of time. Quit smoking. This should keep you from developing a frequent cough. SEEK MEDICAL CARE IF:  A bulge develops in your groin area. You feel pain, a burning sensation or pressure in the groin. This might be worse if you are lifting or straining. You develop a fever of more than 100.5 F (38.1 C). SEEK IMMEDIATE MEDICAL CARE IF:  Pain in the groin increases suddenly. A bulge in the groin gets bigger suddenly and does not go down. For men, there is sudden pain in the scrotum. Or, the size of the scrotum increases. A bulge in the groin area becomes red or purple and is painful to touch. You have nausea or vomiting that does not go away. You feel your heart beating much faster than  normal. You cannot have a bowel movement or pass gas. You develop a fever of more than 102.0 F (38.9 C).   This information is not intended to replace advice given to you by your health care provider. Make sure you discuss any questions you have with your health care provider.   Document Released: 12/08/2008 Document Revised: 10/14/2011 Document Reviewed: 01/23/2015 Elsevier Interactive Patient Education [employer]

## 2023-06-19 NOTE — Telephone Encounter (Signed)
Left message for patient to call, please inform him of the following regarding scheduled surgery with Dr. Claudine Mouton.   Pre-Admission date/time, and Surgery date at [facility].  Surgery Date: 06/25/23 Preadmission Testing Date: 06/23/23 (phone 8a-1p)  Also patient will need to call at [phone], between 1-3:00pm the day before surgery, to find out what time to arrive for surgery.

## 2023-06-20 NOTE — Telephone Encounter (Signed)
Left message again for patient to call. Also called his mom, Misty Stanley.  She will have him to call.

## 2023-06-20 NOTE — Telephone Encounter (Signed)
Patient calls back, he is now informed of all dates regarding surgery.

## 2023-06-23 ENCOUNTER — Other Ambulatory Visit: Payer: Self-pay

## 2023-06-23 ENCOUNTER — Encounter
Admission: RE | Admit: 2023-06-23 | Discharge: 2023-06-23 | Disposition: A | Discharge status: Home / Self Care | Payer: MEDICAID | Source: Ambulatory Visit | Attending: Surgery | Admitting: Surgery

## 2023-06-23 HISTORY — DX: Localized swelling, mass and lump, right upper limb: R22.31

## 2023-06-23 HISTORY — DX: Other psychoactive substance use, unspecified, in remission: F19.91

## 2023-06-23 HISTORY — DX: Cannabis use, unspecified, uncomplicated: F12.90

## 2023-06-23 HISTORY — DX: Nicotine dependence, unspecified, uncomplicated: F17.200

## 2023-06-23 HISTORY — DX: Disorder of penis, unspecified: N48.9

## 2023-06-23 HISTORY — DX: Ventral hernia without obstruction or gangrene: K43.9

## 2023-06-23 HISTORY — DX: Generalized enlarged lymph nodes: R59.1

## 2023-06-23 HISTORY — DX: Other fatigue: R53.83

## 2023-06-23 HISTORY — DX: Unilateral inguinal hernia, without obstruction or gangrene, not specified as recurrent: K40.90

## 2023-06-23 HISTORY — DX: Unspecified abdominal hernia without obstruction or gangrene: K46.9

## 2023-06-23 NOTE — Patient Instructions (Addendum)
Your procedure is scheduled on:Wednesday November 20  Report to the Registration Desk on the 1st floor of the [facility] To find out your arrival time, please call [phone] between 1PM - 3PM on:  Tuesday November 19  If your arrival time is 6:00 am, do not arrive before that time as the [facility] entrance doors do not open until 6:00 am.  REMEMBER: Instructions that are not followed completely may result in serious medical risk, up to and including death; or upon the discretion of your surgeon and anesthesiologist your surgery may need to be rescheduled.  Do not eat food after midnight the night before surgery.  No gum chewing or hard candies.   One week prior to surgery: Stop Anti-inflammatories (NSAIDS) such as Advil, Aleve, Ibuprofen, Motrin, Naproxen, Naprosyn and Aspirin based products such as Excedrin, Goody's Powder, BC Powder. Stop ANY OVER THE COUNTER supplements until after surgery.  You may however, continue to take Tylenol if needed for pain up until the day of surgery.  Continue taking all of your other prescription medications up until the day of surgery.  ON THE DAY OF SURGERY ONLY TAKE THESE MEDICATIONS WITH SIPS OF WATER:  ARIPiprazole (ABILIFY)   No Alcohol for 24 hours before or after surgery.  No Smoking including e-cigarettes for 24 hours before surgery.  No chewable tobacco products for at least 6 hours before surgery.  No nicotine patches on the day of surgery.  Do not use any "recreational" drugs for at least a week (preferably 2 weeks) before your surgery.  Please be advised that the combination of cocaine and anesthesia may have negative outcomes, up to and including death. If you test positive for cocaine, your surgery will be cancelled.  On the morning of surgery brush your teeth with toothpaste and water, you may rinse your mouth with mouthwash if you wish. Do not swallow any toothpaste or mouthwash.  Use CHG Soap or wipes as directed on  instruction sheet.  Do not wear jewelry, make-up, hairpins, clips or nail polish.  For welded (permanent) jewelry: bracelets, anklets, waist bands, etc.  Please have this removed prior to surgery.  If it is not removed, there is a chance that hospital personnel will need to cut it off on the day of surgery.  Do not wear lotions, powders, or perfumes.   Do not shave body hair from the neck down 48 hours before surgery.  Contact lenses, hearing aids and dentures may not be worn into surgery.  Do not bring valuables to the hospital. [facility] is not responsible for any missing/lost belongings or valuables.   Notify your doctor if there is any change in your medical condition (cold, fever, infection).  Wear comfortable clothing (specific to your surgery type) to the hospital.  After surgery, you can help prevent lung complications by doing breathing exercises.  Take deep breaths and cough every 1-2 hours.   When coughing or sneezing, hold a pillow firmly against your incision with both hands. This is called "splinting." Doing this helps protect your incision. It also decreases belly discomfort.  If you are being discharged the day of surgery, you will not be allowed to drive home. You will need a responsible individual to drive you home and stay with you for 24 hours after surgery.   If you are taking public transportation, you will need to have a responsible individual with you.  Please call the Pre-admissions Testing Dept. at [phone] if you have any questions  about these instructions.  Surgery Visitation Policy:  Patients having surgery or a procedure may have two visitors.  Children under the age of 49 must have an adult with them who is not the patient.          Preparing for Surgery with CHLORHEXIDINE GLUCONATE (CHG) Soap  Chlorhexidine Gluconate (CHG) Soap  o An antiseptic cleaner that kills germs and bonds with the skin to continue killing germs even after  washing  o Used for showering the night before surgery and morning of surgery  Before surgery, you can play an important role by reducing the number of germs on your skin.  CHG (Chlorhexidine gluconate) soap is an antiseptic cleanser which kills germs and bonds with the skin to continue killing germs even after washing.  Please do not use if you have an allergy to CHG or antibacterial soaps. If your skin becomes reddened/irritated stop using the CHG.  1. Shower the NIGHT BEFORE SURGERY and the MORNING OF SURGERY with CHG soap.  2. If you choose to wash your hair, wash your hair first as usual with your normal shampoo.  3. After shampooing, rinse your hair and body thoroughly to remove the shampoo.  4. Use CHG as you would any other liquid soap. You can apply CHG directly to the skin and wash gently with a scrungie or a clean washcloth.  5. Apply the CHG soap to your body only from the neck down. Do not use on open wounds or open sores. Avoid contact with your eyes, ears, mouth, and genitals (private parts). Wash face and genitals (private parts) with your normal soap.  6. Wash thoroughly, paying special attention to the area where your surgery will be performed.  7. Thoroughly rinse your body with warm water.  8. Do not shower/wash with your normal soap after using and rinsing off the CHG soap.  9. Pat yourself dry with a clean towel.  10. Wear clean pajamas to bed the night before surgery.  12. Place clean sheets on your bed the night of your first shower and do not sleep with pets.  13. Shower again with the CHG soap on the day of surgery prior to arriving at the hospital.  14. Do not apply any deodorants/lotions/powders.  15. Please wear clean clothes to the hospital.

## 2023-06-24 MED ORDER — BUPIVACAINE LIPOSOME 1.3 % IJ SUSP
20.0000 mL | Freq: Once | INTRAMUSCULAR | Status: DC
Start: 2023-06-24 — End: 2023-06-25

## 2023-06-24 MED ORDER — CHLORHEXIDINE GLUCONATE CLOTH 2 % EX PADS
6.0000 | MEDICATED_PAD | Freq: Once | CUTANEOUS | Status: AC
Start: 2023-06-24 — End: 2023-06-25
  Administered 2023-06-25: 6 via TOPICAL

## 2023-06-24 MED ORDER — CELECOXIB 200 MG PO CAPS
200.0000 mg | ORAL_CAPSULE | ORAL | Status: AC
  Administered 2023-06-25: 200 mg via ORAL

## 2023-06-24 MED ORDER — GABAPENTIN 300 MG PO CAPS
300.0000 mg | ORAL_CAPSULE | ORAL | Status: AC
  Administered 2023-06-25: 300 mg via ORAL

## 2023-06-24 MED ORDER — CHLORHEXIDINE GLUCONATE CLOTH 2 % EX PADS: 6.0000 | MEDICATED_PAD | Freq: Once | CUTANEOUS | Status: DC

## 2023-06-24 MED ORDER — ORAL CARE MOUTH RINSE: 15.0000 mL | Freq: Once | OROMUCOSAL | Status: AC

## 2023-06-24 MED ORDER — LACTATED RINGERS IV SOLN
INTRAVENOUS | Status: DC
Start: 2023-06-24 — End: 2023-06-25

## 2023-06-24 MED ORDER — CHLORHEXIDINE GLUCONATE 0.12 % MT SOLN
15.0000 mL | Freq: Once | OROMUCOSAL | Status: AC
  Administered 2023-06-25: 15 mL via OROMUCOSAL

## 2023-06-24 MED ORDER — ACETAMINOPHEN 500 MG PO TABS
1000.0000 mg | ORAL_TABLET | ORAL | Status: AC
  Administered 2023-06-25: 1000 mg via ORAL

## 2023-06-24 MED ORDER — CEFAZOLIN SODIUM-DEXTROSE 2-4 GM/100ML-% IV SOLN
2.0000 g | INTRAVENOUS | Status: AC
  Administered 2023-06-25: 2 g via INTRAVENOUS

## 2023-06-25 ENCOUNTER — Ambulatory Visit: Payer: MEDICAID | Admitting: Registered Nurse

## 2023-06-25 ENCOUNTER — Other Ambulatory Visit: Payer: Self-pay

## 2023-06-25 ENCOUNTER — Encounter
Admission: RE | Disposition: A | Discharge status: Home / Self Care | Payer: Self-pay | Source: Home / Self Care | Attending: Surgery

## 2023-06-25 ENCOUNTER — Ambulatory Visit
Admission: RE | Admit: 2023-06-25 | Discharge: 2023-06-25 | Disposition: A | Discharge status: Home / Self Care | Payer: MEDICAID | Attending: Surgery | Admitting: Surgery

## 2023-06-25 ENCOUNTER — Encounter: Payer: Self-pay | Admitting: Surgery

## 2023-06-25 DIAGNOSIS — K4020 Bilateral inguinal hernia, without obstruction or gangrene, not specified as recurrent: Secondary | ICD-10-CM | POA: Insufficient documentation

## 2023-06-25 DIAGNOSIS — K420 Umbilical hernia with obstruction, without gangrene: Secondary | ICD-10-CM

## 2023-06-25 DIAGNOSIS — K429 Umbilical hernia without obstruction or gangrene: Secondary | ICD-10-CM | POA: Insufficient documentation

## 2023-06-25 DIAGNOSIS — K4090 Unilateral inguinal hernia, without obstruction or gangrene, not specified as recurrent: Secondary | ICD-10-CM

## 2023-06-25 DIAGNOSIS — K436 Other and unspecified ventral hernia with obstruction, without gangrene: Secondary | ICD-10-CM

## 2023-06-25 DIAGNOSIS — Z87891 Personal history of nicotine dependence: Secondary | ICD-10-CM | POA: Insufficient documentation

## 2023-06-25 HISTORY — PX: UMBILICAL HERNIA REPAIR: SHX196

## 2023-06-25 HISTORY — PX: INSERTION OF MESH: SHX5868

## 2023-06-25 SURGERY — HERNIORRHAPHY, INGUINAL, ROBOT-ASSISTED, LAPAROSCOPIC
Anesthesia: General

## 2023-06-25 MED ORDER — CELECOXIB 200 MG PO CAPS
ORAL_CAPSULE | ORAL | Status: AC
  Filled 2023-06-25: qty 1

## 2023-06-25 MED ORDER — SUGAMMADEX SODIUM 200 MG/2ML IV SOLN
INTRAVENOUS | Status: DC | PRN
  Administered 2023-06-25: 138.8 mg via INTRAVENOUS

## 2023-06-25 MED ORDER — FENTANYL CITRATE (PF) 100 MCG/2ML IJ SOLN
INTRAMUSCULAR | Status: DC | PRN
  Administered 2023-06-25 (×2): 50 ug via INTRAVENOUS

## 2023-06-25 MED ORDER — MIDAZOLAM HCL 2 MG/2ML IJ SOLN
INTRAMUSCULAR | Status: DC | PRN
  Administered 2023-06-25: 2 mg via INTRAVENOUS

## 2023-06-25 MED ORDER — ACETAMINOPHEN 500 MG PO TABS
ORAL_TABLET | ORAL | Status: AC
  Filled 2023-06-25: qty 2

## 2023-06-25 MED ORDER — MIDAZOLAM HCL 2 MG/2ML IJ SOLN
INTRAMUSCULAR | Status: AC
  Filled 2023-06-25: qty 2

## 2023-06-25 MED ORDER — ROCURONIUM BROMIDE 10 MG/ML (PF) SYRINGE
PREFILLED_SYRINGE | INTRAVENOUS | Status: AC
  Filled 2023-06-25: qty 10

## 2023-06-25 MED ORDER — BUPIVACAINE-EPINEPHRINE 0.25% -1:200000 IJ SOLN
INTRAMUSCULAR | Status: DC | PRN
  Administered 2023-06-25: 30 mL

## 2023-06-25 MED ORDER — LIDOCAINE HCL (PF) 2 % IJ SOLN
INTRAMUSCULAR | Status: AC
  Filled 2023-06-25: qty 5

## 2023-06-25 MED ORDER — IBUPROFEN 200 MG PO TABS: 600.0000 mg | ORAL_TABLET | Freq: Four times a day (QID) | ORAL | 0 refills | Status: AC | PRN

## 2023-06-25 MED ORDER — HYDROMORPHONE HCL 1 MG/ML IJ SOLN
0.5000 mg | INTRAMUSCULAR | Status: AC | PRN
  Administered 2023-06-25 (×4): 0.5 mg via INTRAVENOUS

## 2023-06-25 MED ORDER — CHLORHEXIDINE GLUCONATE 0.12 % MT SOLN
OROMUCOSAL | Status: AC
Start: 2023-06-25 — End: ?
  Filled 2023-06-25: qty 15

## 2023-06-25 MED ORDER — LIDOCAINE HCL (CARDIAC) PF 100 MG/5ML IV SOSY
PREFILLED_SYRINGE | INTRAVENOUS | Status: DC | PRN
  Administered 2023-06-25: 80 mg via INTRAVENOUS

## 2023-06-25 MED ORDER — PROPOFOL 10 MG/ML IV BOLUS
INTRAVENOUS | Status: AC
  Filled 2023-06-25: qty 40

## 2023-06-25 MED ORDER — FENTANYL CITRATE (PF) 100 MCG/2ML IJ SOLN
INTRAMUSCULAR | Status: AC
  Filled 2023-06-25: qty 2

## 2023-06-25 MED ORDER — ROCURONIUM BROMIDE 100 MG/10ML IV SOLN
INTRAVENOUS | Status: DC | PRN
  Administered 2023-06-25: 60 mg via INTRAVENOUS

## 2023-06-25 MED ORDER — HYDROMORPHONE HCL 1 MG/ML IJ SOLN
INTRAMUSCULAR | Status: AC
  Filled 2023-06-25: qty 1

## 2023-06-25 MED ORDER — BUPIVACAINE-EPINEPHRINE (PF) 0.25% -1:200000 IJ SOLN
INTRAMUSCULAR | Status: AC
  Filled 2023-06-25: qty 30

## 2023-06-25 MED ORDER — DEXAMETHASONE SODIUM PHOSPHATE 10 MG/ML IJ SOLN
INTRAMUSCULAR | Status: AC
  Filled 2023-06-25: qty 1

## 2023-06-25 MED ORDER — OXYCODONE HCL 5 MG/5ML PO SOLN: 5.0000 mg | Freq: Once | ORAL | Status: AC | PRN

## 2023-06-25 MED ORDER — DEXAMETHASONE SODIUM PHOSPHATE 10 MG/ML IJ SOLN
INTRAMUSCULAR | Status: DC | PRN
  Administered 2023-06-25: 10 mg via INTRAVENOUS

## 2023-06-25 MED ORDER — ONDANSETRON HCL 4 MG/2ML IJ SOLN
INTRAMUSCULAR | Status: DC | PRN
  Administered 2023-06-25: 4 mg via INTRAVENOUS

## 2023-06-25 MED ORDER — GABAPENTIN 300 MG PO CAPS
ORAL_CAPSULE | ORAL | Status: AC
  Filled 2023-06-25: qty 1

## 2023-06-25 MED ORDER — CEFAZOLIN SODIUM-DEXTROSE 2-4 GM/100ML-% IV SOLN
INTRAVENOUS | Status: AC
  Filled 2023-06-25: qty 100

## 2023-06-25 MED ORDER — HYDROCODONE-ACETAMINOPHEN 5-325 MG PO TABS: 1.0000 | ORAL_TABLET | Freq: Four times a day (QID) | ORAL | 0 refills | Status: DC | PRN

## 2023-06-25 MED ORDER — PROPOFOL 10 MG/ML IV BOLUS
INTRAVENOUS | Status: DC | PRN
  Administered 2023-06-25: 200 mg via INTRAVENOUS

## 2023-06-25 MED ORDER — OXYCODONE HCL 5 MG PO TABS
5.0000 mg | ORAL_TABLET | Freq: Once | ORAL | Status: AC | PRN
  Administered 2023-06-25: 5 mg via ORAL

## 2023-06-25 MED ORDER — OXYCODONE HCL 5 MG PO TABS
ORAL_TABLET | ORAL | Status: AC
  Filled 2023-06-25: qty 1

## 2023-06-25 MED ORDER — ONDANSETRON HCL 4 MG/2ML IJ SOLN
INTRAMUSCULAR | Status: AC
  Filled 2023-06-25: qty 2

## 2023-06-25 MED ORDER — FENTANYL CITRATE (PF) 100 MCG/2ML IJ SOLN
25.0000 ug | INTRAMUSCULAR | Status: DC | PRN
  Administered 2023-06-25: 25 ug via INTRAVENOUS
  Administered 2023-06-25: 50 ug via INTRAVENOUS
  Administered 2023-06-25: 25 ug via INTRAVENOUS

## 2023-06-25 SURGICAL SUPPLY — 56 items
BLADE CLIPPER SURG (BLADE) ×4 IMPLANT
BLADE SURG 15 STRL LF DISP TIS (BLADE) ×4 IMPLANT
CHLORAPREP W/TINT 26 (MISCELLANEOUS) ×4 IMPLANT
COVER TIP SHEARS 8 DVNC (MISCELLANEOUS) ×4 IMPLANT
COVER WAND RF STERILE (DRAPES) ×4 IMPLANT
DERMABOND ADVANCED .7 DNX12 (GAUZE/BANDAGES/DRESSINGS) ×4 IMPLANT
DRAPE ARM DVNC X/XI (DISPOSABLE) ×12 IMPLANT
DRAPE COLUMN DVNC XI (DISPOSABLE) ×4 IMPLANT
DRAPE LAPAROTOMY 77X122 PED (DRAPES) ×4 IMPLANT
DRAPE UTILITY 15X26 TOWEL STRL (DRAPES) ×4 IMPLANT
ELECT CAUTERY BLADE 6.4 (BLADE) ×4 IMPLANT
ELECT REM PT RETURN 9FT ADLT (ELECTROSURGICAL) ×3
ELECTRODE REM PT RTRN 9FT ADLT (ELECTROSURGICAL) ×4 IMPLANT
FORCEPS BPLR R/ABLATION 8 DVNC (INSTRUMENTS) ×4 IMPLANT
GAUZE 4X4 16PLY ~~LOC~~+RFID DBL (SPONGE) ×4 IMPLANT
GLOVE ORTHO TXT STRL SZ7.5 (GLOVE) ×12 IMPLANT
GOWN STRL REUS W/ TWL LRG LVL3 (GOWN DISPOSABLE) ×4 IMPLANT
GOWN STRL REUS W/ TWL XL LVL3 (GOWN DISPOSABLE) ×8 IMPLANT
GRASPER SUT TROCAR 14GX15 (MISCELLANEOUS) IMPLANT
IRRIGATION STRYKERFLOW (MISCELLANEOUS) IMPLANT
IRRIGATOR STRYKERFLOW (MISCELLANEOUS)
IV NS 1000ML BAXH (IV SOLUTION) IMPLANT
KIT PINK PAD W/HEAD ARE REST (MISCELLANEOUS) ×3
KIT PINK PAD W/HEAD ARM REST (MISCELLANEOUS) ×4 IMPLANT
KIT TURNOVER KIT A (KITS) ×4 IMPLANT
LABEL OR SOLS (LABEL) ×4 IMPLANT
MANIFOLD NEPTUNE II (INSTRUMENTS) ×4 IMPLANT
MESH 3DMAX LIGHT 4.1X6.2 LT LR (Mesh General) IMPLANT
MESH 3DMAX LIGHT 4.1X6.2 RT LR (Mesh General) IMPLANT
NDL DRIVE SUT CUT DVNC (INSTRUMENTS) ×4 IMPLANT
NDL HYPO 22X1.5 SAFETY MO (MISCELLANEOUS) ×4 IMPLANT
NDL INSUFFLATION 14GA 120MM (NEEDLE) IMPLANT
NEEDLE DRIVE SUT CUT DVNC (INSTRUMENTS) ×3
NEEDLE HYPO 22X1.5 SAFETY MO (MISCELLANEOUS) ×3
NEEDLE INSUFFLATION 14GA 120MM (NEEDLE)
NS IRRIG 500ML POUR BTL (IV SOLUTION) ×4 IMPLANT
PACK BASIN MINOR ARMC (MISCELLANEOUS) ×4 IMPLANT
PACK LAP CHOLECYSTECTOMY (MISCELLANEOUS) ×4 IMPLANT
PENCIL SMOKE EVACUATOR COATED (MISCELLANEOUS) IMPLANT
SCISSORS MNPLR CVD DVNC XI (INSTRUMENTS) ×4 IMPLANT
SEAL UNIV 5-12 XI (MISCELLANEOUS) ×12 IMPLANT
SET TUBE SMOKE EVAC HIGH FLOW (TUBING) ×4 IMPLANT
SOL ELECTROSURG ANTI STICK (MISCELLANEOUS) ×3
SOLUTION ELECTROSURG ANTI STCK (MISCELLANEOUS) ×4 IMPLANT
SPIKE FLUID TRANSFER (MISCELLANEOUS) ×4 IMPLANT
SUT ETHIBOND 0 MO6 C/R (SUTURE) ×4 IMPLANT
SUT MNCRL 4-0 27XMFL (SUTURE) ×3
SUT STRATA 2-0 23CM CT-2 (SUTURE) IMPLANT
SUT STRATA 3-0 SH (SUTURE) IMPLANT
SUT VIC AB 2-0 SH 27XBRD (SUTURE) ×4 IMPLANT
SUT VIC AB 3-0 SH 27X BRD (SUTURE) ×4 IMPLANT
SUT VICRYL 0 UR6 27IN ABS (SUTURE) IMPLANT
SUTURE MNCRL 4-0 27XMF (SUTURE) ×4 IMPLANT
SYR 10ML LL (SYRINGE) ×4 IMPLANT
TRAP FLUID SMOKE EVACUATOR (MISCELLANEOUS) ×4 IMPLANT
WATER STERILE IRR 500ML POUR (IV SOLUTION) ×4 IMPLANT

## 2023-06-25 NOTE — Anesthesia Procedure Notes (Signed)
Procedure Name: Intubation Date/Time: 06/25/2023 9:39 AM  Performed by: Emeterio Reeve, CRNAPre-anesthesia Checklist: Patient identified, Emergency Drugs available, Suction available and Patient being monitored Patient Re-evaluated:Patient Re-evaluated prior to induction Oxygen Delivery Method: Circle system utilized Preoxygenation: Pre-oxygenation with 100% oxygen Induction Type: IV induction Ventilation: Mask ventilation without difficulty Laryngoscope Size: Mac and 4 Grade View: Grade II Tube type: Oral Tube size: 7.5 mm Number of attempts: 1 Airway Equipment and Method: Stylet and Oral airway Placement Confirmation: ETT inserted through vocal cords under direct vision, positive ETCO2 and breath sounds checked- equal and bilateral Secured at: 23 cm Tube secured with: Tape Dental Injury: Teeth and Oropharynx as per pre-operative assessment  Comments: Cords clear; easy mask airway with OPA. CA

## 2023-06-25 NOTE — Op Note (Addendum)
Robotic assisted Laparoscopic Transabdominal Bilateral Inguinal Hernia Repair with Mesh, Repair of supraumbilical fascial defect (AA wall, initial, 5 mm fascial defect incarcerated with adipose tissue from falciform.)       Pre-operative Diagnosis:  Left Inguinal Hernia, supraumbilical hernia.    Post-operative Diagnosis: Bilateral direct inguinal hernias, supraumbilical hernia.    Procedure: Robotic assisted Laparoscopic  repair of bilateral inguinal hernia(s), and repair of 5 mm AA wall initial fascial defect with incarcerated preperitoneal/falciform adipose tissue.    Surgeon: Campbell Lerner, M.D., FACS   Anesthesia: GETA   Findings: Bilateral direct inguinal hernia, evidence of AA preperitoneal adipose incarceration via 5 mm supraumbilical fascial defect.         Procedure Details  The patient was seen again in the Holding Room. The benefits, complications, treatment options, and expected outcomes were discussed with the patient. The risks of bleeding, infection, recurrence of symptoms, failure to resolve symptoms, recurrence of hernia, ischemic orchitis, chronic pain syndrome or neuroma, were reviewed again. The likelihood of improving the patient's symptoms with return to their baseline status is good.  The patient and/or family concurred with the proposed plan, giving informed consent.  The patient was taken to Operating Room, identified  and the procedure verified as Laparoscopic Inguinal Hernia Repair. Laterality confirmed.  A Time Out was held and the above information confirmed.   Prior to the induction of general anesthesia, antibiotic prophylaxis was administered. VTE prophylaxis was in place. General endotracheal anesthesia was then administered and tolerated well. After the induction, the abdomen was prepped with Chloraprep and draped in the sterile fashion. The patient was positioned in the supine position.  The soft tissue nodule/mass supra-umbilical area is reduced.  After  local infiltration of quarter percent Marcaine with epinephrine, stab incision was made left upper quadrant.  On the left at Palmer's point, the Veress needle is passed with sensation of the layers to penetrate the abdominal wall and into the peritoneum.  Saline drop test is confirmed peritoneal placement.  Insufflation is initiated with carbon dioxide to pressures of 15 mmHg. An 8.5 mm port is placed to the left off of the midline, with blunt tipped trocar.  Pneumoperitoneum maintained w/o HD changes  to pressures of 15 mm Hg with CO2. No evidence of bowel injuries.  Two 8.5 mm ports placed under direct vision in each upper quadrant. The laparoscopy revealed bilateral direct defect(s).  The reduced falciform ligamentous adipose tissue was noted.  The robot was brought to the table and docked in the standard fashion, no collision between arms was observed. Instruments were kept under direct view at all times. For bilateral inguinal hernia repair,  I developed a peritoneal flap. The sac(s) were reduced and dissected free from adjacent structures. We preserved the vas and the vessels, and visualized them to their convergence and beyond in the retroperitoneum. Once dissection was completed a left sided  large  BARD 3D Light mesh was placed and secured at three points with interrupted 2-0 Vicryl to the pubic tubercle and anteriorly. There was good coverage of the direct, indirect and femoral spaces.  Second look revealed no complications or injuries.  Attention then was turned to the opposite side. The sac was also reduced and dissected free from adjacent structures. We preserved the vas and the vessels, in like manner with adequate posterior dissection. Once dissection was completed the same, but contralateral mesh was placed and secured in like manner with interrupted 2-0 Vicryl. There was good coverage of the direct, indirect and femoral  spaces.  The flap was then closed with 3-0 V-lock suture.   Peritoneal closure without defects.    I then dissected away the falciform ligament adipose tissue at the umbilicus where I could identify the reduced fatty mass.  This was dissected away from the underlying fascia where a 5 mm defect was identified.  There is no other adjacent defect appreciated.  I still had a significant amount of 3-0 V-Loc and utilized it to obliterate the defect with a horizontal mattress suture.  This was completed transversely closing the small defect completely.  I felt no additional reinforcement was necessary.   Once assuring that hemostasis was adequate, all needles/sponges removed, and the robot was undocked.  Under direct visualization I placed the Veress needle into the preperitoneal space the Veress' valve was released allowing extraperitoneal CO2 to escape, it was also used to access the space for supplemental local anesthesia. The ports were removed, the abdomen desulflated.  4-0 subcuticular Monocryl was used at all skin edges. Dermabond was placed.  Patient tolerated the procedure well. There were no complications. He was taken to the recovery room in stable condition.           Campbell Lerner, M.D., FACS 06/25/2023, 11:32 AM

## 2023-06-25 NOTE — Anesthesia Preprocedure Evaluation (Signed)
Anesthesia Evaluation  Patient identified by MRN, date of birth, ID band Patient awake    Reviewed: Allergy & Precautions, NPO status , Patient's Chart, lab work & pertinent test results  History of Anesthesia Complications Negative for: history of anesthetic complications  Airway Mallampati: II  TM Distance: >3 FB Neck ROM: full    Dental  (+) Chipped   Pulmonary neg shortness of breath, former smoker   Pulmonary exam normal        Cardiovascular Exercise Tolerance: Good (-) angina (-) Past MI and (-) DOE negative cardio ROS Normal cardiovascular exam     Neuro/Psych  PSYCHIATRIC DISORDERS      negative neurological ROS     GI/Hepatic negative GI ROS, Neg liver ROS,neg GERD  ,,  Endo/Other  negative endocrine ROS    Renal/GU      Musculoskeletal   Abdominal   Peds  Hematology negative hematology ROS (+)   Anesthesia Other Findings Past Medical History: No date: Abdominal hernia without obstruction and without gangrene No date: Anxiety No date: Arm mass, right No date: Depression No date: Epigastric hernia No date: Fatigue No date: History of illicit drug use No date: Hyperlipidemia No date: Left inguinal hernia No date: Lymphadenopathy No date: Marijuana use No date: Nicotine use disorder No date: Penile lesion No date: Schizophrenia [facility])  Past Surgical History: 2014: FINGER FRACTURE SURGERY     Reproductive/Obstetrics negative OB ROS                             Anesthesia Physical Anesthesia Plan  ASA: 2  Anesthesia Plan: General ETT   Post-op Pain Management:    Induction: Intravenous  PONV Risk Score and Plan: Ondansetron, Dexamethasone, Midazolam and Treatment may vary due to age or medical condition  Airway Management Planned: Oral ETT  Additional Equipment:   Intra-op Plan:   Post-operative Plan: Extubation in OR  Informed Consent: I have reviewed  the patients History and Physical, chart, labs and discussed the procedure including the risks, benefits and alternatives for the proposed anesthesia with the patient or authorized representative who has indicated his/her understanding and acceptance.     Dental Advisory Given  Plan Discussed with: Anesthesiologist, CRNA and Surgeon  Anesthesia Plan Comments: (Patient consented for risks of anesthesia including but not limited to:  - adverse reactions to medications - damage to eyes, teeth, lips or other oral mucosa - nerve damage due to positioning  - sore throat or hoarseness - Damage to heart, brain, nerves, lungs, other parts of body or loss of life  Patient voiced understanding and assent.)       Anesthesia Quick Evaluation

## 2023-06-25 NOTE — Interval H&P Note (Signed)
History and Physical Interval Note:  06/25/2023 9:11 AM  Dylan Thompson  has presented today for surgery, with the diagnosis of left inguinal hernia and umbilical hernia less 3 cm.  The various methods of treatment have been discussed with the patient and family. After consideration of risks, benefits and other options for treatment, the patient has consented to  Procedure(s): XI ROBOTIC ASSISTED INGUINAL HERNIA, possible right (Left) HERNIA REPAIR UMBILICAL ADULT (N/A) INSERTION OF MESH as a surgical intervention.  The patient's history has been reviewed, patient examined, no change in status, stable for surgery.  I have reviewed the patient's chart and labs.  Questions were answered to the patient's satisfaction.   The left hip is marked.   Campbell Lerner

## 2023-06-25 NOTE — Transfer of Care (Signed)
Immediate Anesthesia Transfer of Care Note  Patient: Dylan Thompson  Procedure(s) Performed: XI ROBOTIC ASSISTED INGUINAL HERNIA, possible right (Bilateral) HERNIA REPAIR UMBILICAL ADULT INSERTION OF MESH  Patient Location: PACU  Anesthesia Type:General  Level of Consciousness: drowsy and patient cooperative  Airway & Oxygen Therapy: Patient Spontanous Breathing and Patient connected to face mask oxygen  Post-op Assessment: Report given to RN and Post -op Vital signs reviewed and stable  Post vital signs: stable  Last Vitals:  Vitals Value Taken Time  BP 134/93 06/25/23 1124  Temp    Pulse 84 06/25/23 1126  Resp 25 06/25/23 1126  SpO2 100 % 06/25/23 1126  Vitals shown include unfiled device data.  Last Pain:  Vitals:   06/25/23 0830  TempSrc: Temporal  PainSc: 2          Complications: No notable events documented.

## 2023-06-26 ENCOUNTER — Encounter: Payer: Self-pay | Admitting: Surgery

## 2023-06-26 NOTE — Anesthesia Postprocedure Evaluation (Signed)
Anesthesia Post Note  Patient: Dylan Thompson  Procedure(s) Performed: XI ROBOTIC ASSISTED INGUINAL HERNIA, possible right (Bilateral) HERNIA REPAIR UMBILICAL ADULT INSERTION OF MESH  Patient location during evaluation: PACU Anesthesia Type: General Level of consciousness: awake and alert Pain management: pain level controlled Vital Signs Assessment: post-procedure vital signs reviewed and stable Respiratory status: spontaneous breathing, nonlabored ventilation, respiratory function stable and patient connected to nasal cannula oxygen Cardiovascular status: blood pressure returned to baseline and stable Postop Assessment: no apparent nausea or vomiting Anesthetic complications: no   No notable events documented.   Last Vitals:  Vitals:   06/25/23 1230 06/25/23 1252  BP: 119/77 126/78  Pulse: 71 65  Resp: 14 16  Temp: 36.7 C 36.7 C  SpO2: 94% 95%    Last Pain:  Vitals:   06/25/23 1252  TempSrc: Temporal  PainSc: 2                  Cleda Mccreedy Mala Gibbard

## 2023-07-10 ENCOUNTER — Ambulatory Visit: Payer: MEDICAID | Admitting: Surgery

## 2023-07-10 ENCOUNTER — Encounter: Payer: Self-pay | Admitting: Surgery

## 2023-07-10 VITALS — BP 99/66 | HR 67 | Temp 98.0°F | Ht 71.0 in | Wt 162.0 lb

## 2023-07-10 DIAGNOSIS — K4020 Bilateral inguinal hernia, without obstruction or gangrene, not specified as recurrent: Secondary | ICD-10-CM

## 2023-07-10 DIAGNOSIS — Z8719 Personal history of other diseases of the digestive system: Secondary | ICD-10-CM | POA: Insufficient documentation

## 2023-07-10 DIAGNOSIS — Z09 Encounter for follow-up examination after completed treatment for conditions other than malignant neoplasm: Secondary | ICD-10-CM

## 2023-07-10 NOTE — Progress Notes (Signed)
[facility] POST-OP OFFICE VISIT  07/10/2023  HPI: Dylan Thompson is a 37 y.o. male 15 days s/p robotic bilateral inguinal hernia repair, with epigastric hernia repair.  He reports his postoperative pain is improving, just a little bit of sensation in the area when he is moving.  No incisional pain.  No nausea, vomiting, fevers or chills.  Tolerating diet well.  Vital signs: BP 99/66   Pulse 67   Temp 98 F (36.7 C)   Ht 5\' 11"  (1.803 m)   Wt 162 lb (73.5 kg)   SpO2 97%   BMI 22.59 kg/m    Physical Exam: Constitutional: He appears well. Abdomen: Nondistended, incisions clean dry and intact.  Soft nontender.  Bilateral groins without bulges.  No evidence of hematoma or mass.  Assessment/Plan: This is a 37 y.o. male 15 days s/p robotic bilateral inguinal hernia repairs.  And epigastric hernia repair.  Patient Active Problem List   Diagnosis Date Noted   Incarcerated epigastric hernia 06/25/2023   Left inguinal hernia 06/19/2023   Epigastric hernia 06/19/2023   Ingrown toenail of both feet 04/18/2023   Penile lesion 04/18/2023   Lymphadenopathy 03/06/2023   Tick bite of right upper arm 03/06/2023   Fatigue 03/06/2023   Encounter for lipid screening for cardiovascular disease 03/06/2023   Diabetes mellitus screening 03/06/2023   Arm mass, right 03/06/2023   Bite 03/06/2023   Abdominal hernia without obstruction and without gangrene 03/06/2023   Laboratory test 11/30/2019   Nicotine use disorder 08/31/2019   Marijuana use 08/31/2019   History of illicit drug use 08/31/2019   Screening for STDs (sexually transmitted diseases) 08/31/2019   Fracture of finger, middle phalanx, closed 12/17/2010   Schizophrenia, chronic condition (HCC) 07/24/2010    -May resume activity.  May drive.  Follow-up as needed.   Campbell Lerner M.D., FACS 07/10/2023, 1:45 PM

## 2023-07-10 NOTE — Patient Instructions (Signed)

## 2023-07-21 ENCOUNTER — Encounter: Payer: Self-pay | Admitting: Podiatry

## 2023-07-21 ENCOUNTER — Ambulatory Visit: Payer: MEDICAID | Admitting: Podiatry

## 2023-07-21 ENCOUNTER — Ambulatory Visit (INDEPENDENT_AMBULATORY_CARE_PROVIDER_SITE_OTHER): Payer: MEDICAID | Admitting: Podiatry

## 2023-07-21 DIAGNOSIS — L600 Ingrowing nail: Secondary | ICD-10-CM | POA: Diagnosis not present

## 2023-07-22 NOTE — Progress Notes (Signed)
  Subjective:  Patient ID: Dylan Thompson, male    DOB: Oct 12, 1985,  MRN: [id number]  Chief Complaint  Patient presents with   Nail Problem    "I have two ingrown toenails, one on each big toe."    37 y.o. male presents with the above complaint. History confirmed with patient.  He gets them often the lateral borders has not had any major infections that he knows of  Objective:  Physical Exam: warm, good capillary refill, no trophic changes or ulcerative lesions, normal DP and PT pulses, normal sensory exam, and previous ingrown toenails of lateral border both hallux, no active infection.  Assessment:   1. Ingrowing right great toenail   2. Ingrowing left great toenail      Plan:  Patient was evaluated and treated and all questions answered.  We discussed permanent treatment of his ingrowing toenails with permanent partial matricectomy.  He was not prepared to do this today and would like to return for a visit we discussed the procedure in detail the recovery process and taking care of it after and he will return at his convenience for scheduling for this.  No follow-ups on file.

## 2023-08-07 ENCOUNTER — Ambulatory Visit: Payer: MEDICAID | Admitting: Podiatry

## 2023-10-16 ENCOUNTER — Encounter: Payer: Medicaid Other | Admitting: Nurse Practitioner

## 2024-04-16 ENCOUNTER — Ambulatory Visit: Payer: MEDICAID | Admitting: Nurse Practitioner

## 2024-09-23 ENCOUNTER — Ambulatory Visit: Payer: MEDICAID | Admitting: Nurse Practitioner
